# Patient Record
Sex: Male | Born: 1985 | Race: Black or African American | Hispanic: No | Marital: Married | Smoking: Former smoker
Health system: Southern US, Community
[De-identification: ages and names within clinical notes are randomized; demographics above are authoritative.]

## PROBLEM LIST (undated history)

## (undated) DIAGNOSIS — M48061 Spinal stenosis, lumbar region without neurogenic claudication: Secondary | ICD-10-CM

## (undated) DIAGNOSIS — J45909 Unspecified asthma, uncomplicated: Secondary | ICD-10-CM

---
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
## 2006-10-12 ENCOUNTER — Emergency Department (HOSPITAL_COMMUNITY): Admission: EM | Admit: 2006-10-12 | Discharge: 2006-10-12 | Payer: Self-pay | Admitting: Emergency Medicine

## 2020-08-25 HISTORY — PX: COLONOSCOPY: SHX174

## 2021-05-01 ENCOUNTER — Other Ambulatory Visit: Payer: Self-pay | Admitting: Neurological Surgery

## 2021-05-01 DIAGNOSIS — G959 Disease of spinal cord, unspecified: Secondary | ICD-10-CM

## 2021-05-11 ENCOUNTER — Ambulatory Visit
Admission: RE | Admit: 2021-05-11 | Discharge: 2021-05-11 | Disposition: A | Discharge status: Home / Self Care | Payer: Self-pay | Source: Ambulatory Visit | Attending: Neurological Surgery | Admitting: Neurological Surgery

## 2021-05-11 DIAGNOSIS — G959 Disease of spinal cord, unspecified: Secondary | ICD-10-CM

## 2021-05-12 ENCOUNTER — Other Ambulatory Visit: Payer: Self-pay

## 2021-05-14 ENCOUNTER — Other Ambulatory Visit: Payer: Self-pay | Admitting: Neurological Surgery

## 2021-05-22 ENCOUNTER — Other Ambulatory Visit: Payer: Self-pay | Admitting: Neurological Surgery

## 2021-05-23 NOTE — Pre-Procedure Instructions (Signed)
Surgical Instructions    Your procedure is scheduled on Tuesday, June 04, 2021 at 11:21 AM.  Report to [facility] Main Entrance "A" at 9:20 A.M., then check in with the Admitting office.  Call this number if you have problems the morning of surgery:  [phone]   If you have any questions prior to your surgery date call [phone]: Open Monday-Friday 8am-4pm    Remember:  Do not eat or drink after midnight the night before your surgery    Take these medicines the morning of surgery with A SIP OF WATER  DULoxetine (CYMBALTA) gabapentin (NEURONTIN)  IF NEEDED: HYDROcodone-acetaminophen (NORCO/VICODIN) omeprazole (PRILOSEC)  As of today, STOP taking any Aspirin (unless otherwise instructed by your surgeon) Aleve, naproxen (NAPROSYN), Ibuprofen, Motrin, Advil, Goody's, BC's, diclofenac Sodium (VOLTAREN), all herbal medications, fish oil, and all vitamins.                     Do NOT Smoke (Tobacco/Vaping) or drink Alcohol 24 hours prior to your procedure.  If you use a CPAP at night, you may bring all equipment for your overnight stay.   Contacts, glasses, piercing's, hearing aid's, dentures or partials may not be worn into surgery, please bring cases for these belongings.    For patients admitted to the hospital, discharge time will be determined by your treatment team.   Patients discharged the day of surgery will not be allowed to drive home, and someone needs to stay with them for 24 hours.  NO VISITORS WILL BE ALLOWED IN PRE-OP WHERE PATIENTS GET READY FOR SURGERY.  ONLY 1 SUPPORT PERSON MAY BE PRESENT IN THE WAITING ROOM WHILE YOU ARE IN SURGERY.  IF YOU ARE TO BE ADMITTED, ONCE YOU ARE IN YOUR ROOM YOU WILL BE ALLOWED TWO (2) VISITORS.  Minor children may have two parents present. Special consideration for safety and communication needs will be reviewed on a case by case basis.   Special instructions:   [place]- Preparing For Surgery  Before surgery, you can  play an important role. Because skin is not sterile, your skin needs to be as free of germs as possible. You can reduce the number of germs on your skin by washing with CHG (chlorahexidine gluconate) Soap before surgery.  CHG is an antiseptic cleaner which kills germs and bonds with the skin to continue killing germs even after washing.    Oral Hygiene is also important to reduce your risk of infection.  Remember - BRUSH YOUR TEETH THE MORNING OF SURGERY WITH YOUR REGULAR TOOTHPASTE  Please do not use if you have an allergy to CHG or antibacterial soaps. If your skin becomes reddened/irritated stop using the CHG.  Do not shave (including legs and underarms) for at least 48 hours prior to first CHG shower. It is OK to shave your face.  Please follow these instructions carefully.   Shower the NIGHT BEFORE SURGERY and the MORNING OF SURGERY  If you chose to wash your hair, wash your hair first as usual with your normal shampoo.  After you shampoo, rinse your hair and body thoroughly to remove the shampoo.  Use CHG Soap as you would any other liquid soap. You can apply CHG directly to the skin and wash gently with a scrungie or a clean washcloth.   Apply the CHG Soap to your body ONLY FROM THE NECK DOWN.  Do not use on open wounds or open sores. Avoid contact with your eyes, ears, mouth and genitals (private  parts). Wash Face and genitals (private parts)  with your normal soap.   Wash thoroughly, paying special attention to the area where your surgery will be performed.  Thoroughly rinse your body with warm water from the neck down.  DO NOT shower/wash with your normal soap after using and rinsing off the CHG Soap.  Pat yourself dry with a CLEAN TOWEL.  Wear CLEAN PAJAMAS to bed the night before surgery  Place CLEAN SHEETS on your bed the night before your surgery  DO NOT SLEEP WITH PETS.   Day of Surgery: Shower with CHG soap. Do not wear jewelry. Do not wear lotions, powders,  colognes, or deodorant. Men may shave face and neck. Do not bring valuables to the hospital. [facility] is not responsible for any belongings or valuables. Wear Clean/Comfortable clothing the morning of surgery Remember to brush your teeth WITH YOUR REGULAR TOOTHPASTE.   Please read over the following fact sheets that you were given.   3 days prior to your procedure or After your COVID test   You are not required to quarantine however you are required to wear a well-fitting mask when you are out and around people not in your household. If your mask becomes wet or soiled, replace with a new one.   Wash your hands often with soap and water for 20 seconds or clean your hands with an alcohol-based hand sanitizer that contains at least 60% alcohol.   Do not share personal items.   Notify your provider:  o if you are in close contact with someone who has COVID  o or if you develop a fever of 100.4 or greater, sneezing, cough, sore throat, shortness of breath or body aches.

## 2021-05-24 ENCOUNTER — Encounter (HOSPITAL_COMMUNITY)
Admission: RE | Admit: 2021-05-24 | Discharge: 2021-05-24 | Disposition: A | Discharge status: Home / Self Care | Payer: 59 | Source: Ambulatory Visit | Attending: Neurological Surgery | Admitting: Neurological Surgery

## 2021-05-24 ENCOUNTER — Encounter (HOSPITAL_COMMUNITY): Payer: Self-pay

## 2021-05-24 ENCOUNTER — Other Ambulatory Visit: Payer: Self-pay

## 2021-05-24 DIAGNOSIS — Z01812 Encounter for preprocedural laboratory examination: Secondary | ICD-10-CM | POA: Diagnosis not present

## 2021-05-24 HISTORY — DX: Unspecified asthma, uncomplicated: J45.909

## 2021-05-24 LAB — TYPE AND SCREEN
ABO/RH(D): O POS
Antibody Screen: NEGATIVE

## 2021-05-24 LAB — SURGICAL PCR SCREEN
MRSA, PCR: NEGATIVE
Staphylococcus aureus: NEGATIVE

## 2021-05-24 LAB — CBC
HCT: 43.5 % (ref 39.0–52.0)
Hemoglobin: 14.3 g/dL (ref 13.0–17.0)
MCH: 28.6 pg (ref 26.0–34.0)
MCHC: 32.9 g/dL (ref 30.0–36.0)
MCV: 87 fL (ref 80.0–100.0)
Platelets: 281 10*3/uL (ref 150–400)
RBC: 5 MIL/uL (ref 4.22–5.81)
RDW: 13.7 % (ref 11.5–15.5)
WBC: 8.4 10*3/uL (ref 4.0–10.5)
nRBC: 0 % (ref 0.0–0.2)

## 2021-05-24 NOTE — Progress Notes (Signed)
PCP - Lonie Peak PA-C Cardiologist - Denies  PPM/ICD - Denies  Chest x-ray - N/A EKG - N/A Stress Test - Denies ECHO - Denies Cardiac Cath - Denies  Sleep Study - Denies  Patient denies having diabetes.  Blood Thinner Instructions: N/A Aspirin Instructions: N/A  ERAS Protcol - No  COVID TEST- Scheduled 05/31/21 @ 1100   Anesthesia review: No  Patient denies shortness of breath, fever, cough and chest pain at PAT appointment   All instructions explained to the patient, with a verbal understanding of the material. Patient agrees to go over the instructions while at home for a better understanding. Patient also instructed to self quarantine after being tested for COVID-19. The opportunity to ask questions was provided.

## 2021-05-31 ENCOUNTER — Other Ambulatory Visit (HOSPITAL_COMMUNITY)
Admission: RE | Admit: 2021-05-31 | Discharge: 2021-05-31 | Disposition: A | Discharge status: Home / Self Care | Payer: 59 | Source: Ambulatory Visit | Attending: Neurological Surgery | Admitting: Neurological Surgery

## 2021-05-31 DIAGNOSIS — Z01812 Encounter for preprocedural laboratory examination: Secondary | ICD-10-CM | POA: Insufficient documentation

## 2021-05-31 DIAGNOSIS — Z20822 Contact with and (suspected) exposure to covid-19: Secondary | ICD-10-CM | POA: Diagnosis not present

## 2021-05-31 LAB — SARS CORONAVIRUS 2 (TAT 6-24 HRS): SARS Coronavirus 2: NEGATIVE

## 2021-06-04 ENCOUNTER — Encounter (HOSPITAL_COMMUNITY): Payer: Self-pay | Admitting: Neurological Surgery

## 2021-06-04 ENCOUNTER — Ambulatory Visit (HOSPITAL_COMMUNITY): Payer: 59 | Admitting: Anesthesiology

## 2021-06-04 ENCOUNTER — Other Ambulatory Visit: Payer: Self-pay

## 2021-06-04 ENCOUNTER — Observation Stay (HOSPITAL_COMMUNITY)
Admission: RE | Admit: 2021-06-04 | Discharge: 2021-06-05 | Disposition: A | Discharge status: Home / Self Care | Payer: 59 | Attending: Neurological Surgery | Admitting: Neurological Surgery

## 2021-06-04 ENCOUNTER — Ambulatory Visit (HOSPITAL_COMMUNITY): Payer: 59

## 2021-06-04 ENCOUNTER — Encounter (HOSPITAL_COMMUNITY)
Admission: RE | Disposition: A | Discharge status: Home / Self Care | Payer: Self-pay | Source: Home / Self Care | Attending: Neurological Surgery

## 2021-06-04 DIAGNOSIS — G959 Disease of spinal cord, unspecified: Secondary | ICD-10-CM | POA: Diagnosis present

## 2021-06-04 DIAGNOSIS — Z79899 Other long term (current) drug therapy: Secondary | ICD-10-CM | POA: Insufficient documentation

## 2021-06-04 DIAGNOSIS — M4722 Other spondylosis with radiculopathy, cervical region: Secondary | ICD-10-CM | POA: Diagnosis not present

## 2021-06-04 DIAGNOSIS — M4802 Spinal stenosis, cervical region: Secondary | ICD-10-CM | POA: Insufficient documentation

## 2021-06-04 DIAGNOSIS — J45909 Unspecified asthma, uncomplicated: Secondary | ICD-10-CM | POA: Insufficient documentation

## 2021-06-04 DIAGNOSIS — M4712 Other spondylosis with myelopathy, cervical region: Secondary | ICD-10-CM | POA: Insufficient documentation

## 2021-06-04 DIAGNOSIS — Z419 Encounter for procedure for purposes other than remedying health state, unspecified: Secondary | ICD-10-CM

## 2021-06-04 HISTORY — PX: ANTERIOR CERVICAL DECOMP/DISCECTOMY FUSION: SHX1161

## 2021-06-04 LAB — ABO/RH: ABO/RH(D): O POS

## 2021-06-04 SURGERY — ANTERIOR CERVICAL DECOMPRESSION/DISCECTOMY FUSION 1 LEVEL
Anesthesia: General | Site: Neck

## 2021-06-04 MED ORDER — MIDAZOLAM HCL 2 MG/2ML IJ SOLN
INTRAMUSCULAR | Status: DC | PRN
  Administered 2021-06-04: 2 mg via INTRAVENOUS

## 2021-06-04 MED ORDER — ONDANSETRON HCL 4 MG PO TABS: 4.0000 mg | ORAL_TABLET | Freq: Four times a day (QID) | ORAL | Status: DC | PRN

## 2021-06-04 MED ORDER — HYDROCODONE-ACETAMINOPHEN 7.5-325 MG PO TABS: 1.0000 | ORAL_TABLET | ORAL | Status: DC | PRN

## 2021-06-04 MED ORDER — DULOXETINE HCL 30 MG PO CPEP
30.0000 mg | ORAL_CAPSULE | Freq: Every day | ORAL | Status: DC
  Administered 2021-06-05: 30 mg via ORAL
  Filled 2021-06-04: qty 1

## 2021-06-04 MED ORDER — THROMBIN 5000 UNITS EX SOLR
CUTANEOUS | Status: DC | PRN
  Administered 2021-06-04 (×2): 5000 [IU] via TOPICAL

## 2021-06-04 MED ORDER — SUGAMMADEX SODIUM 200 MG/2ML IV SOLN
INTRAVENOUS | Status: DC | PRN
  Administered 2021-06-04: 250 mg via INTRAVENOUS

## 2021-06-04 MED ORDER — FENTANYL CITRATE (PF) 100 MCG/2ML IJ SOLN
INTRAMUSCULAR | Status: AC
  Filled 2021-06-04: qty 2

## 2021-06-04 MED ORDER — PROPOFOL 10 MG/ML IV BOLUS
INTRAVENOUS | Status: AC
  Filled 2021-06-04: qty 40

## 2021-06-04 MED ORDER — CHLORHEXIDINE GLUCONATE CLOTH 2 % EX PADS: 6.0000 | MEDICATED_PAD | Freq: Once | CUTANEOUS | Status: DC

## 2021-06-04 MED ORDER — ONDANSETRON HCL 4 MG/2ML IJ SOLN
INTRAMUSCULAR | Status: DC | PRN
  Administered 2021-06-04: 4 mg via INTRAVENOUS

## 2021-06-04 MED ORDER — MIDAZOLAM HCL 2 MG/2ML IJ SOLN
INTRAMUSCULAR | Status: AC
  Filled 2021-06-04: qty 2

## 2021-06-04 MED ORDER — TRIAMCINOLONE ACETONIDE 40 MG/ML IJ SUSP
INTRAMUSCULAR | Status: AC
  Filled 2021-06-04: qty 5

## 2021-06-04 MED ORDER — FENTANYL CITRATE (PF) 250 MCG/5ML IJ SOLN
INTRAMUSCULAR | Status: AC
  Filled 2021-06-04: qty 5

## 2021-06-04 MED ORDER — GABAPENTIN 300 MG PO CAPS
300.0000 mg | ORAL_CAPSULE | Freq: Every day | ORAL | Status: DC
  Administered 2021-06-05: 300 mg via ORAL
  Filled 2021-06-04 (×2): qty 1

## 2021-06-04 MED ORDER — THROMBIN 5000 UNITS EX SOLR: OROMUCOSAL | Status: DC | PRN

## 2021-06-04 MED ORDER — SODIUM CHLORIDE 0.9 % IV SOLN
250.0000 mL | INTRAVENOUS | Status: DC
  Administered 2021-06-04: 250 mL via INTRAVENOUS

## 2021-06-04 MED ORDER — CEFAZOLIN SODIUM-DEXTROSE 2-4 GM/100ML-% IV SOLN
2.0000 g | INTRAVENOUS | Status: AC
  Administered 2021-06-04: 2 g via INTRAVENOUS
  Filled 2021-06-04: qty 100

## 2021-06-04 MED ORDER — METHOCARBAMOL 500 MG PO TABS
500.0000 mg | ORAL_TABLET | Freq: Four times a day (QID) | ORAL | Status: DC | PRN
  Administered 2021-06-04 – 2021-06-05 (×3): 500 mg via ORAL
  Filled 2021-06-04 (×3): qty 1

## 2021-06-04 MED ORDER — LACTATED RINGERS IV SOLN: INTRAVENOUS | Status: DC

## 2021-06-04 MED ORDER — PROPOFOL 10 MG/ML IV BOLUS
INTRAVENOUS | Status: DC | PRN
  Administered 2021-06-04: 200 mg via INTRAVENOUS

## 2021-06-04 MED ORDER — PANTOPRAZOLE SODIUM 40 MG PO TBEC
80.0000 mg | DELAYED_RELEASE_TABLET | Freq: Every day | ORAL | Status: DC
  Administered 2021-06-04 – 2021-06-05 (×2): 80 mg via ORAL
  Filled 2021-06-04 (×2): qty 2

## 2021-06-04 MED ORDER — CEFAZOLIN SODIUM-DEXTROSE 2-4 GM/100ML-% IV SOLN
2.0000 g | Freq: Three times a day (TID) | INTRAVENOUS | Status: AC
  Administered 2021-06-04 – 2021-06-05 (×2): 2 g via INTRAVENOUS
  Filled 2021-06-04 (×2): qty 100

## 2021-06-04 MED ORDER — METHOCARBAMOL 1000 MG/10ML IJ SOLN
500.0000 mg | Freq: Four times a day (QID) | INTRAVENOUS | Status: DC | PRN
  Filled 2021-06-04: qty 5

## 2021-06-04 MED ORDER — FENTANYL CITRATE (PF) 100 MCG/2ML IJ SOLN
25.0000 ug | INTRAMUSCULAR | Status: DC | PRN
  Administered 2021-06-04 (×3): 50 ug via INTRAVENOUS

## 2021-06-04 MED ORDER — FENTANYL CITRATE (PF) 250 MCG/5ML IJ SOLN
INTRAMUSCULAR | Status: DC | PRN
  Administered 2021-06-04: 50 ug via INTRAVENOUS
  Administered 2021-06-04: 25 ug via INTRAVENOUS
  Administered 2021-06-04 (×3): 50 ug via INTRAVENOUS
  Administered 2021-06-04: 25 ug via INTRAVENOUS
  Administered 2021-06-04: 50 ug via INTRAVENOUS

## 2021-06-04 MED ORDER — SUCCINYLCHOLINE CHLORIDE 200 MG/10ML IV SOSY
PREFILLED_SYRINGE | INTRAVENOUS | Status: DC | PRN
  Administered 2021-06-04: 140 mg via INTRAVENOUS

## 2021-06-04 MED ORDER — GABAPENTIN 300 MG PO CAPS
600.0000 mg | ORAL_CAPSULE | Freq: Every day | ORAL | Status: DC
  Administered 2021-06-04: 600 mg via ORAL
  Filled 2021-06-04: qty 2

## 2021-06-04 MED ORDER — 0.9 % SODIUM CHLORIDE (POUR BTL) OPTIME
TOPICAL | Status: DC | PRN
  Administered 2021-06-04: 1000 mL

## 2021-06-04 MED ORDER — ORAL CARE MOUTH RINSE: 15.0000 mL | Freq: Once | OROMUCOSAL | Status: AC

## 2021-06-04 MED ORDER — CHLORHEXIDINE GLUCONATE 0.12 % MT SOLN
15.0000 mL | Freq: Once | OROMUCOSAL | Status: AC
  Administered 2021-06-04: 15 mL via OROMUCOSAL
  Filled 2021-06-04: qty 15

## 2021-06-04 MED ORDER — OXYCODONE HCL 5 MG PO TABS
10.0000 mg | ORAL_TABLET | ORAL | Status: DC | PRN
  Administered 2021-06-04 – 2021-06-05 (×6): 10 mg via ORAL
  Filled 2021-06-04 (×6): qty 2

## 2021-06-04 MED ORDER — LIDOCAINE 2% (20 MG/ML) 5 ML SYRINGE
INTRAMUSCULAR | Status: DC | PRN
  Administered 2021-06-04: 100 mg via INTRAVENOUS

## 2021-06-04 MED ORDER — TRIAMCINOLONE ACETONIDE 40 MG/ML IJ SUSP
INTRAMUSCULAR | Status: DC | PRN
  Administered 2021-06-04: 40 mg via INTRAMUSCULAR

## 2021-06-04 MED ORDER — KETOROLAC TROMETHAMINE 15 MG/ML IJ SOLN
15.0000 mg | Freq: Four times a day (QID) | INTRAMUSCULAR | Status: DC
  Administered 2021-06-04 – 2021-06-05 (×3): 15 mg via INTRAVENOUS
  Filled 2021-06-04 (×3): qty 1

## 2021-06-04 MED ORDER — LABETALOL HCL 100 MG PO TABS
100.0000 mg | ORAL_TABLET | Freq: Four times a day (QID) | ORAL | Status: DC | PRN
  Administered 2021-06-04: 100 mg via ORAL
  Filled 2021-06-04 (×2): qty 1

## 2021-06-04 MED ORDER — DEXAMETHASONE SODIUM PHOSPHATE 10 MG/ML IJ SOLN
INTRAMUSCULAR | Status: DC | PRN
Start: 2021-06-04 — End: 2021-06-04
  Administered 2021-06-04: 10 mg via INTRAVENOUS

## 2021-06-04 MED ORDER — ROCURONIUM BROMIDE 10 MG/ML (PF) SYRINGE
PREFILLED_SYRINGE | INTRAVENOUS | Status: DC | PRN
  Administered 2021-06-04: 50 mg via INTRAVENOUS
  Administered 2021-06-04: 20 mg via INTRAVENOUS
  Administered 2021-06-04: 30 mg via INTRAVENOUS

## 2021-06-04 MED ORDER — ACETAMINOPHEN 650 MG RE SUPP: 650.0000 mg | RECTAL | Status: DC | PRN

## 2021-06-04 MED ORDER — SODIUM CHLORIDE 0.9% FLUSH: 3.0000 mL | Freq: Two times a day (BID) | INTRAVENOUS | Status: DC

## 2021-06-04 MED ORDER — PHENOL 1.4 % MT LIQD: 1.0000 | OROMUCOSAL | Status: DC | PRN

## 2021-06-04 MED ORDER — ACETAMINOPHEN 325 MG PO TABS
650.0000 mg | ORAL_TABLET | ORAL | Status: DC | PRN
  Administered 2021-06-04 – 2021-06-05 (×3): 650 mg via ORAL
  Filled 2021-06-04 (×3): qty 2

## 2021-06-04 MED ORDER — ONDANSETRON HCL 4 MG/2ML IJ SOLN: 4.0000 mg | Freq: Four times a day (QID) | INTRAMUSCULAR | Status: DC | PRN

## 2021-06-04 MED ORDER — SENNOSIDES-DOCUSATE SODIUM 8.6-50 MG PO TABS: 1.0000 | ORAL_TABLET | Freq: Every evening | ORAL | Status: DC | PRN

## 2021-06-04 MED ORDER — SODIUM CHLORIDE 0.9% FLUSH: 3.0000 mL | INTRAVENOUS | Status: DC | PRN

## 2021-06-04 MED ORDER — MENTHOL 3 MG MT LOZG
1.0000 | LOZENGE | OROMUCOSAL | Status: DC | PRN
  Filled 2021-06-04: qty 9

## 2021-06-04 SURGICAL SUPPLY — 56 items
BAG COUNTER SPONGE SURGICOUNT (BAG) ×2 IMPLANT
BAND RUBBER #18 3X1/16 STRL (MISCELLANEOUS) ×4 IMPLANT
BENZOIN TINCTURE PRP APPL 2/3 (GAUZE/BANDAGES/DRESSINGS) IMPLANT
BIT DRILL NEURO 2X3.1 SFT TUCH (MISCELLANEOUS) ×1 IMPLANT
BLADE CLIPPER SURG (BLADE) IMPLANT
BUR CARBIDE MATCH 3.0 (BURR) ×2 IMPLANT
CANISTER SUCT 3000ML PPV (MISCELLANEOUS) ×2 IMPLANT
CARTRIDGE OIL MAESTRO DRILL (MISCELLANEOUS) ×1 IMPLANT
COVER MAYO STAND STRL (DRAPES) ×4 IMPLANT
DIFFUSER DRILL AIR PNEUMATIC (MISCELLANEOUS) ×2 IMPLANT
DRAPE C-ARM 42X72 X-RAY (DRAPES) ×2 IMPLANT
DRAPE HALF SHEET 40X57 (DRAPES) IMPLANT
DRAPE LAPAROTOMY 100X72X124 (DRAPES) ×2 IMPLANT
DRAPE MICROSCOPE LEICA (MISCELLANEOUS) ×2 IMPLANT
DRILL NEURO 2X3.1 SOFT TOUCH (MISCELLANEOUS) ×2
DURAPREP 6ML APPLICATOR 50/CS (WOUND CARE) ×2 IMPLANT
ELECT COATED BLADE 2.86 ST (ELECTRODE) ×2 IMPLANT
ELECT REM PT RETURN 9FT ADLT (ELECTROSURGICAL) ×2
ELECTRODE REM PT RTRN 9FT ADLT (ELECTROSURGICAL) ×1 IMPLANT
EVACUATOR 1/8 PVC DRAIN (DRAIN) IMPLANT
GAUZE 4X4 16PLY ~~LOC~~+RFID DBL (SPONGE) IMPLANT
GLOVE EXAM NITRILE LRG STRL (GLOVE) IMPLANT
GLOVE EXAM NITRILE XL STR (GLOVE) IMPLANT
GLOVE EXAM NITRILE XS STR PU (GLOVE) IMPLANT
GLOVE SRG 8 PF TXTR STRL LF DI (GLOVE) ×1 IMPLANT
GLOVE SURG LTX SZ8 (GLOVE) ×4 IMPLANT
GLOVE SURG UNDER POLY LF SZ8 (GLOVE) ×2
GOWN STRL REUS W/ TWL LRG LVL3 (GOWN DISPOSABLE) IMPLANT
GOWN STRL REUS W/ TWL XL LVL3 (GOWN DISPOSABLE) ×1 IMPLANT
GOWN STRL REUS W/TWL 2XL LVL3 (GOWN DISPOSABLE) IMPLANT
GOWN STRL REUS W/TWL LRG LVL3 (GOWN DISPOSABLE)
GOWN STRL REUS W/TWL XL LVL3 (GOWN DISPOSABLE) ×2
HEMOSTAT POWDER KIT SURGIFOAM (HEMOSTASIS) ×2 IMPLANT
KIT BASIN OR (CUSTOM PROCEDURE TRAY) ×2 IMPLANT
KIT TURNOVER KIT B (KITS) ×2 IMPLANT
NEEDLE HYPO 22GX1.5 SAFETY (NEEDLE) ×2 IMPLANT
NEEDLE SPNL 18GX3.5 QUINCKE PK (NEEDLE) ×2 IMPLANT
NS IRRIG 1000ML POUR BTL (IV SOLUTION) ×2 IMPLANT
OIL CARTRIDGE MAESTRO DRILL (MISCELLANEOUS) ×2
PACK LAMINECTOMY NEURO (CUSTOM PROCEDURE TRAY) ×2 IMPLANT
PAD ARMBOARD 7.5X6 YLW CONV (MISCELLANEOUS) ×6 IMPLANT
PIN DISTRACTION 14MM (PIN) ×4 IMPLANT
PLATE ANT CERV OZARK 22 1LVL (Plate) ×2 IMPLANT
PUTTY BONE 100 VESUVIUS 1CC (Putty) ×2 IMPLANT
SCREW FIXED ST OZARK 4X16 (Screw) ×4 IMPLANT
SCREW VA ST OZARK 4X16 (Plate) ×4 IMPLANT
SPACER ANGLD CASCAD 16X13X8 7D (Spacer) ×2 IMPLANT
SPONGE INTESTINAL PEANUT (DISPOSABLE) ×2 IMPLANT
SPONGE SURGIFOAM ABS GEL SZ50 (HEMOSTASIS) ×4 IMPLANT
STAPLER VISISTAT 35W (STAPLE) IMPLANT
STRIP CLOSURE SKIN 1/2X4 (GAUZE/BANDAGES/DRESSINGS) ×2 IMPLANT
TAPE SURG TRANSPORE 1 IN (GAUZE/BANDAGES/DRESSINGS) ×1 IMPLANT
TAPE SURGICAL TRANSPORE 1 IN (GAUZE/BANDAGES/DRESSINGS) ×2
TOWEL GREEN STERILE (TOWEL DISPOSABLE) ×2 IMPLANT
TOWEL GREEN STERILE FF (TOWEL DISPOSABLE) ×2 IMPLANT
WATER STERILE IRR 1000ML POUR (IV SOLUTION) ×2 IMPLANT

## 2021-06-04 NOTE — Progress Notes (Signed)
Dr. Chilton Si aware of BP.

## 2021-06-04 NOTE — Op Note (Signed)
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 Arthrodesis C4-5, anterior interbody technique  2. Placement of intervertebral biomechanical device C4-5; K2 M [place] titanium interbody 8 x 13 x 16 mm 3. Placement of anterior instrumentation consisting of interbody plate and screws -K2 M [place] plate, 20 mm; 16 mm screws 4. Discectomy at C4-5 for decompression of spinal cord and exiting nerve roots  5. Use of morselized bone allograft  6. Use of intraoperative microscope  SURGEON: Dr. Monia Pouch, DO  ASSISTANT: Dr. Barnett Abu, MD  ANESTHESIA: General Endotracheal  EBL: 50 cc  SPECIMENS: None  DRAINS: None  COMPLICATIONS: None immediate  CONDITION: Hemodynamically stable to PACU  HISTORY: Dan Gonzalez is a 35 y.o. y.o. male who initially presented to the outpatient clinic with signs and symptoms consistent with lumbar radiculopathy.  On examination he was found to have hyperreflexia and complained of some numbness and tingling in his fingertips.  MRI of the cervical spine revealed severe stenosis at C4-5 with cord signal change due to chronic myelopathy.  Given this, I recommend surgical decompression and fusion in the form of a C4-5 ACDF.  We discussed all risks, benefits and expected outcomes as well as alternatives including conservative measures however has difficulty with fine motor movement of his upper extremities have progressively worsened over the past few months therefore he elected undergo surgical intervention.  PROCEDURE IN DETAIL: The patient was brought to the operating room and transferred to the operative table. After induction of general anesthesia, the patient was positioned on the operative table in the supine position with all pressure points meticulously padded. The skin of the neck was  then prepped and draped in the usual sterile fashion.  After timeout was conducted, skin incision was then made sharply with a 10 blade and Bovie electrocautery was used to dissect the subcutaneous tissue until the platysma was identified. The platysma was then divided and undermined. The sternocleidomastoid muscle was then identified and, utilizing natural fascial planes in the neck, the prevertebral fascia was identified and the carotid sheath was retracted laterally and the trachea and esophagus retracted medially. Again using fluoroscopy, the correct disc space was identified, C4-5. Bovie electrocautery was used to dissect in the subperiosteal plane and elevate the bilateral longus coli muscles. Self-retaining retractors were then placed under the longus coli muscles bilaterally. At this point, the microscope was draped and brought into the field, and the remainder of the case was done under the microscope using microdissecting technique.  Distraction pins were placed in midline above and below the disc space.  The disc space was placed in distraction.  The disc space was incised sharply and rongeurs were use to initially complete a discectomy. The high-speed drill was then used to complete discectomy until the posterior annulus was identified and removed and the posterior longitudinal ligament was identified.  Using a high-speed drill, posterior osteophytes were removed superiorly and inferiorly.  Using a microcurette, the PLL was elevated, and Kerrison rongeurs were used to remove the posterior longitudinal ligament and the ventral thecal sac was identified. Using a combination of curettes and ronguers, complete decompression of the thecal sac and exiting nerve roots at this level was completed, and verified using micro-nerve hook. The disc space was taken out of distraction.  There is significant osteophytic spurring bilaterally at this disc space.  Epidural hemostasis was achieved with  Surgifoam.  Having completed our decompression, attention was turned to placement of the intervertebral device. Trial spacers were used to select a 8 mm graft. This graft was then filled with morcellized allograft, and inserted under live fluoroscopy.  After placement of the intervertebral device, the above anterior cervical plate was selected, and placed across the interspace. Using a high-speed drill, the cortex of the cervical vertebral bodies was punctured, and screws inserted in the level above and below, C4, C5. Final fluoroscopic images in AP and lateral projections were taken to confirm good hardware placement.  The plate was final tightened to the manufacturer's recommendation and the screws were locked in place.  At this point, after all counts were verified to be correct, meticulous hemostasis was secured using a combination of bipolar electrocautery and passive hemostatics.  Skin was closed with staples.  Sterile dressing was applied.  The patient tolerated the procedure well and was extubated in the room and taken to the postanesthesia care unit in stable condition.

## 2021-06-04 NOTE — Transfer of Care (Signed)
Immediate Anesthesia Transfer of Care Note  Patient: Deniz Hannan  Procedure(s) Performed: ACDF C45 (Neck)  Patient Location: PACU  Anesthesia Type:General  Level of Consciousness: awake, alert  and patient cooperative  Airway & Oxygen Therapy: Patient Spontanous Breathing and Patient connected to face mask oxygen  Post-op Assessment: Report given to RN and Post -op Vital signs reviewed and stable  Post vital signs: Reviewed and stable  Last Vitals:  Vitals Value Taken Time  BP 148/102 06/04/21 1241  Temp    Pulse 101 06/04/21 1242  Resp 14 06/04/21 1241  SpO2 88 % 06/04/21 1242  Vitals shown include unvalidated device data.  Last Pain:  Vitals:   06/04/21 0927  TempSrc:   PainSc: 8          Complications: No notable events documented.

## 2021-06-04 NOTE — Evaluation (Signed)
Pts diastolic bp consistently above 100s. Md green aware and educated pt to seek attention from pcp once discharged. Md okay with pt going to 3c.

## 2021-06-04 NOTE — Anesthesia Procedure Notes (Signed)
Procedure Name: Intubation Date/Time: 06/04/2021 10:20 AM Performed by: Carlos American, CRNA Pre-anesthesia Checklist: Patient identified, Emergency Drugs available, Suction available and Patient being monitored Patient Re-evaluated:Patient Re-evaluated prior to induction Oxygen Delivery Method: Circle System Utilized Preoxygenation: Pre-oxygenation with 100% oxygen Induction Type: IV induction Ventilation: Mask ventilation without difficulty Laryngoscope Size: Glidescope and 4 Grade View: Grade I Tube type: Oral Tube size: 7.5 mm Number of attempts: 1 Airway Equipment and Method: Stylet Placement Confirmation: ETT inserted through vocal cords under direct vision, positive ETCO2 and breath sounds checked- equal and bilateral Secured at: 23 cm Tube secured with: Tape Dental Injury: Teeth and Oropharynx as per pre-operative assessment

## 2021-06-04 NOTE — Anesthesia Preprocedure Evaluation (Addendum)
Anesthesia Evaluation  Patient identified by MRN, date of birth, ID band Patient awake    Reviewed: Allergy & Precautions, NPO status , Patient's Chart, lab work & pertinent test results  Airway Mallampati: II  TM Distance: >3 FB     Dental   Pulmonary asthma ,    breath sounds clear to auscultation       Cardiovascular negative cardio ROS   Rhythm:Regular Rate:Normal     Neuro/Psych    GI/Hepatic negative GI ROS, Neg liver ROS,   Endo/Other  negative endocrine ROS  Renal/GU negative Renal ROS     Musculoskeletal   Abdominal   Peds  Hematology   Anesthesia Other Findings   Reproductive/Obstetrics                             Anesthesia Physical Anesthesia Plan  ASA: 3  Anesthesia Plan: General   Post-op Pain Management:    Induction: Intravenous  PONV Risk Score and Plan: 3 and Ondansetron, Dexamethasone and Midazolam  Airway Management Planned: Oral ETT  Additional Equipment:   Intra-op Plan:   Post-operative Plan: Extubation in OR  Informed Consent: I have reviewed the patients History and Physical, chart, labs and discussed the procedure including the risks, benefits and alternatives for the proposed anesthesia with the patient or authorized representative who has indicated his/her understanding and acceptance.     Dental advisory given  Plan Discussed with: CRNA and Anesthesiologist  Anesthesia Plan Comments:         Anesthesia Quick Evaluation

## 2021-06-04 NOTE — Anesthesia Postprocedure Evaluation (Signed)
Anesthesia Post Note  Patient: Dan Gonzalez  Procedure(s) Performed: ACDF C45 (Neck)     Patient location during evaluation: PACU Anesthesia Type: General Level of consciousness: awake Pain management: pain level controlled Vital Signs Assessment: post-procedure vital signs reviewed and stable Respiratory status: spontaneous breathing Cardiovascular status: stable Postop Assessment: no apparent nausea or vomiting Anesthetic complications: no   No notable events documented.  Last Vitals:  Vitals:   06/04/21 1340 06/04/21 1355  BP: (!) 164/107 (!) 165/110  Pulse: 98 93  Resp: (!) 21 20  Temp:    SpO2: 98% 97%    Last Pain:  Vitals:   06/04/21 1355  TempSrc:   PainSc: 0-No pain                 Winston Misner

## 2021-06-04 NOTE — H&P (Addendum)
    Providing Compassionate, Quality Care - Together  NEUROSURGERY HISTORY & PHYSICAL   Dan Gonzalez is an 35 y.o. male.   Chief Complaint: BUE numbness HPI: This is a 34 yo M with BUE numbness and tingling that was found to have severe stenosis C4-5 with chronic cord signal change. Presents today for ACDF. No significant changes.  He does complain of right greater than left upper extremity radiculopathy.  He has chronic numbness tingling in his fingertips.  He does intermittently drop objects.  Past Medical History:  Diagnosis Date   Asthma     Past Surgical History:  Procedure Laterality Date   COLONOSCOPY  2022    History reviewed. No pertinent family history. Social History:  reports that he has never smoked. He has never used smokeless tobacco. He reports current drug use. Drug: Marijuana. He reports that he does not drink alcohol.  Allergies: No Known Allergies  Medications Prior to Admission  Medication Sig Dispense Refill   diclofenac Sodium (VOLTAREN) 1 % GEL Apply 1 application topically daily as needed (pain).     DULoxetine (CYMBALTA) 30 MG capsule Take 30 mg by mouth daily.     gabapentin (NEURONTIN) 300 MG capsule Take 300-600 mg by mouth See admin instructions. 300 ,g in  The morning, 600 mg at bedtime     HYDROcodone-acetaminophen (NORCO/VICODIN) 5-325 MG tablet Take 1 tablet by mouth 3 (three) times daily as needed for pain.     naproxen (NAPROSYN) 500 MG tablet Take 500 mg by mouth 2 (two) times daily as needed for pain.     omeprazole (PRILOSEC) 40 MG capsule Take 40 mg by mouth daily as needed (acid reflux).      No results found for this or any previous visit (from the past 48 hour(s)). No results found.  ROS All +/- in HPI  Blood pressure (!) 166/101, pulse 98, temperature 98.1 F (36.7 C), temperature source Oral, resp. rate 18, height 5\' 9"  (1.753 m), weight 106.6 kg, SpO2 98 %. Physical Exam  Aox3 PERRLA EOMI Cn 2-12 intact BUE 4+/5 BLE  5/5 Reflexes bilateral upper extremities 3/4 Hoffmann's positive bilaterally   Assessment/Plan 35 yo FM with:  C4-5 severe stenosis with cord signal change, myelopathy -OR today for ACDF C4-5, all risks/benefits and expected outcomes discussed and agreed upon.    Thank you for allowing me to participate in this patient's care.  Please do not hesitate to call with questions or concerns.   30, DO Neurosurgeon [facility] Cell: [phone]

## 2021-06-04 NOTE — Progress Notes (Signed)
Orthopedic Tech Progress Note Patient Details:  Dan Gonzalez Feb 17, 1986 [id number]  Ortho Devices Type of Ortho Device: Aspen cervical collar Ortho Device/Splint Location: Neck Ortho Device/Splint Interventions: Ordered     Dropped off with PACU nurse.  Darleen Crocker 06/04/2021, 1:42 PM

## 2021-06-05 DIAGNOSIS — M4722 Other spondylosis with radiculopathy, cervical region: Secondary | ICD-10-CM | POA: Diagnosis not present

## 2021-06-05 MED ORDER — METHOCARBAMOL 500 MG PO TABS: 500.0000 mg | ORAL_TABLET | Freq: Four times a day (QID) | ORAL | 1 refills | Status: AC | PRN

## 2021-06-05 MED ORDER — HYDROCODONE-ACETAMINOPHEN 7.5-325 MG PO TABS: 1.0000 | ORAL_TABLET | ORAL | 0 refills | Status: DC | PRN

## 2021-06-05 NOTE — Discharge Summary (Signed)
  Physician Discharge Summary  Patient ID: Dan Gonzalez MRN: [id number] DOB/AGE: 03/13/1986 35 y.o.  Admit date: 06/04/2021 Discharge date: 06/05/2021  Admission Diagnoses:  Cervical myelopathy with severe stenosis, C4-5 with cord signal change  Discharge Diagnoses:  Same Active Problems:   Cervical myelopathy [facility])   Discharged Condition: Stable  Hospital Course:  [name] is a 35 y.o. male that presented for an elective ACDF C4-5 on 06/04/2021.  He tolerated the surgery well.  Postoperatively his pain was controlled on oral medication.  His numbness and tingling was improving compared to preoperative.  He was ambulating independently, having normal bowel bladder function.  His incision was healing appropriately, his dressing was clean dry and intact.  Treatments: Surgery -C4-5 ACDF  Discharge Exam: Blood pressure (!) 151/99, pulse 93, temperature 98 F (36.7 C), temperature source Oral, resp. rate 17, height 5\' 9"  (1.753 m), weight 106.6 kg, SpO2 100 %. Awake, alert, oriented x3 PERRLA Speech fluent, appropriate CN grossly intact 5/5 BUE/BLE Trachea midline Neck soft Wound c/d/i  Disposition: Discharge disposition: 01-Home or Self Care      Discharge Instructions     Incentive spirometry RT   Complete by: As directed       Allergies as of 06/05/2021   No Known Allergies      Medication List     STOP taking these medications    naproxen 500 MG tablet Commonly known as: NAPROSYN       TAKE these medications    diclofenac Sodium 1 % Gel Commonly known as: VOLTAREN Apply 1 application topically daily as needed (pain).   DULoxetine 30 MG capsule Commonly known as: CYMBALTA Take 30 mg by mouth daily.   gabapentin 300 MG capsule Commonly known as: NEURONTIN Take 300-600 mg by mouth See admin instructions. 300 ,g in  The morning, 600 mg at bedtime   HYDROcodone-acetaminophen 5-325 MG tablet Commonly known as: NORCO/VICODIN Take 1 tablet  by mouth 3 (three) times daily as needed for pain. What changed: Another medication with the same name was added. Make sure you understand how and when to take each.   HYDROcodone-acetaminophen 7.5-325 MG tablet Commonly known as: NORCO Take 1 tablet by mouth every 4 (four) hours as needed for moderate pain ((score 4 to 6)). What changed: You were already taking a medication with the same name, and this prescription was added. Make sure you understand how and when to take each.   methocarbamol 500 MG tablet Commonly known as: ROBAXIN Take 1 tablet (500 mg total) by mouth every 6 (six) hours as needed for muscle spasms.   omeprazole 40 MG capsule Commonly known as: PRILOSEC Take 40 mg by mouth daily as needed (acid reflux).        Follow-up Information     12-23-1976, NP Follow up in 1 week(s).   Specialty: Neurosurgery Contact information: [address] [place] [phone]         Karina Nofsinger, [phone] C, DO Follow up in 1 week(s).   Contact information: [address] [place] [phone]                 Signed: [phone] Nikiya Starn 06/05/2021, 8:06 AM

## 2021-06-05 NOTE — Evaluation (Signed)
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 He lives in a 1 level home with 6 STE with his wife and children. Pt was supervision overall for ADLs and mobility. He demonstrated great understanding of back precautions, and how to manage his cervical collar. He did report some numbness in his RUE, mostly in his hand; and RLE weakness. Pt does not require further OT acutely. Recommend d/c to home with supervision initially for all ADLs and mobility.      Recommendations for follow up therapy are one component of a multi-disciplinary discharge planning process, led by the attending physician.  Recommendations may be updated based on patient status, additional functional criteria and insurance authorization.   Follow Up Recommendations  No OT follow up    Equipment Recommendations  3 in 1 bedside commode       Precautions / Restrictions Precautions Precautions: Cervical Precaution Booklet Issued: Yes (comment) Required Braces or Orthoses: Cervical Brace Cervical Brace: Hard collar (applied in sitting, can be off for showers)      Mobility Bed Mobility Overal bed mobility: Needs Assistance Bed Mobility: Rolling;Sidelying to Sit;Sit to Sidelying Rolling: Supervision Sidelying to sit: Supervision     Sit to sidelying: Supervision General bed mobility comments: log roll    Transfers Overall transfer level: Needs assistance Equipment used: None Transfers: Sit to/from Stand Sit to Stand: Supervision         General transfer comment: great safety, no LOB, no AD    Balance Overall  balance assessment: Modified Independent                                         ADL either performed or assessed with clinical judgement   ADL Overall ADL's : Needs assistance/impaired Eating/Feeding: Independent;Sitting   Grooming: Supervision/safety;Standing;Cueing for compensatory techniques   Upper Body Bathing: Supervision/ safety;Sitting;Cueing for compensatory techniques   Lower Body Bathing: Supervison/ safety;Sit to/from stand;Cueing for compensatory techniques   Upper Body Dressing : Supervision/safety;Sitting   Lower Body Dressing: Supervision/safety;Sit to/from stand;Cueing for compensatory techniques   Toilet Transfer: Supervision/safety;Ambulation   Toileting- Clothing Manipulation and Hygiene: Supervision/safety;Sit to/from stand       Functional mobility during ADLs: Supervision/safety General ADL Comments: supervision for safety only - verbal cues for compensatory techniques     Vision Baseline Vision/History: 0 No visual deficits Ability to See in Adequate Light: 0 Adequate Patient Visual Report: No change from baseline Vision Assessment?: No apparent visual deficits     Perception Perception Perception Tested?: No   Praxis      Pertinent Vitals/Pain Pain Assessment: Faces Faces Pain Scale: Hurts a little bit Pain Intervention(s): Monitored during session     Hand Dominance     Extremity/Trunk Assessment Upper Extremity Assessment Upper Extremity Assessment: Overall WFL for tasks assessed   Lower Extremity Assessment Lower Extremity Assessment: Overall WFL for tasks assessed   Cervical / Trunk Assessment Cervical / Trunk Assessment: Other exceptions Cervical / Trunk Exceptions: s/p ACDF   Communication  Communication Communication: No difficulties   Cognition Arousal/Alertness: Awake/alert Behavior During Therapy: WFL for tasks assessed/performed Overall Cognitive Status: Within Functional Limits for tasks assessed                                      General Comments  VSS on RA - wife present for session    Exercises     Shoulder Instructions      Home Living Family/patient expects to be discharged to:: Private residence Living Arrangements: Spouse/significant other;Children (wife, 5 kids) Available Help at Discharge: Family;Available 24 hours/day Type of Home: House Home Access: Stairs to enter [employer] of Steps: 6 Entrance Stairs-Rails: Right;Left Home Layout: One level     Bathroom Shower/Tub: IT trainer: Standard     Home Equipment: None          Prior Functioning/Environment Level of Independence: Independent        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), drives        OT Problem List: Decreased range of motion;Decreased activity tolerance;Impaired balance (sitting and/or standing);Decreased safety awareness;Decreased knowledge of use of DME or AE;Decreased knowledge of precautions;Pain      OT Treatment/Interventions:      OT Goals(Current goals can be found in the care plan section) Acute Rehab OT Goals Patient Stated Goal: home soon OT Goal Formulation: With patient  OT Frequency:     Barriers to D/C:            Co-evaluation              AM-PAC OT "6 Clicks" Daily Activity     Outcome Measure Help from another person eating meals?: None Help from another person taking care of personal grooming?: A Little Help from another person toileting, which includes using toliet, bedpan, or urinal?: None Help from another person bathing (including washing, rinsing, drying)?: None Help from another person to put on and taking off regular upper body clothing?: None Help from another person to put on and taking off regular lower body clothing?: None 6 Click Score: 23   End of Session Equipment Utilized During Treatment: Cervical collar  Activity Tolerance: Patient tolerated  treatment well Patient left: in bed;with call bell/phone within reach;with family/visitor present  OT Visit Diagnosis: Pain;Muscle weakness (generalized) (M62.81)                Time: 1610-9604 OT Time Calculation (min): 14 min Charges:  OT General Charges $OT Visit: 1 Visit OT Evaluation $OT Eval Low Complexity: 1 Low  Veronica Fretz A Sharena Dibenedetto 06/05/2021, 8:53 AM

## 2021-06-05 NOTE — Plan of Care (Signed)
Patient alert and oriented, mae's well, voiding adequate amount of urine, swallowing without difficulty, no c/o pain at time of discharge. Patient discharged home with family. Script and discharged instructions given to patient. Patient and family stated understanding of instructions given. Patient has an appointment with Dr Dawley in 2 weeks 

## 2021-06-05 NOTE — Evaluation (Signed)
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 He overall verbalizes good pain control and is moving well. Ambulating hallway distances with no assistive device and negotiated a half flight of steps with a railing. Of note, he does have chronic back pain with radicular symptoms to distal RLE, causing a mild antalgic gait pattern. It is exacerbated with extension and alleviated with mild flexion.Pt reports plan for future surgery. Provided education regarding brace use and cleaning, cervical precautions and activity recommendations. No PT follow up recommended at this time. Thank you for this consult.      Recommendations for follow up therapy are one component of a multi-disciplinary discharge planning process, led by the attending physician.  Recommendations may be updated based on patient status, additional functional criteria and insurance authorization.  Follow Up Recommendations No PT follow up    Equipment Recommendations  None recommended by PT    Recommendations for Other Services       Precautions / Restrictions Precautions Precautions: Cervical Precaution Booklet Issued: Yes (comment) Required Braces or Orthoses: Cervical Brace Cervical Brace: Hard collar (applied in sitting, can be off for showers) Restrictions Weight Bearing Restrictions: No      Mobility  Bed Mobility Overal bed mobility: Needs Assistance Bed Mobility: Rolling;Sidelying to Sit;Sit to Sidelying Rolling: Supervision Sidelying to sit: Supervision     Sit to sidelying: Supervision General bed mobility comments: OOB upon entry    Transfers Overall transfer level:  Independent Equipment used: None Transfers: Sit to/from Stand Sit to Stand: Supervision         General transfer comment: great safety, no LOB, no AD  Ambulation/Gait Ambulation/Gait assistance: Modified independent (Device/Increase time) Gait Distance (Feet): 400 Feet Assistive device: None Gait Pattern/deviations: Step-through pattern;Decreased stance time - right;Antalgic Gait velocity: decreased   General Gait Details: Mildly antalgic, which is his baseline in setting of RLE pain, no gross imbalance noted  Stairs Stairs: Yes Stairs assistance: Modified independent (Device/Increase time) Stair Management: One rail Right Number of Stairs: 10 General stair comments: pt modifying activity with step by step and cues provided for sequencing/technique  Wheelchair Mobility    Modified Rankin (Stroke Patients Only)       Balance Overall balance assessment: Modified Independent                                           Pertinent Vitals/Pain Pain Assessment: Faces Faces Pain Scale: Hurts little more Pain Location: surgical site, back with radicular pain throughout RLE Pain Intervention(s): Monitored during session    Home Living Family/patient expects to be discharged to:: Private residence Living Arrangements: Spouse/significant other;Children (wife, 5 kids) Available Help at Discharge: Family;Available 24 hours/day Type of Home: House Home Access: Stairs to enter Entrance Stairs-Rails: Doctor, general practice of Steps: 6 Home Layout: One level Home Equipment: None      Prior Function Level of Independence: Independent         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 Pt reporting sensation changes i.e. tingling from back to foot LLE Deficits / Details: Strength 5/5    Cervical / Trunk Assessment Cervical / Trunk Assessment: Other exceptions Cervical / Trunk Exceptions: s/p ACDF  Communication   Communication: No difficulties  Cognition Arousal/Alertness: Awake/alert Behavior During Therapy: WFL for tasks assessed/performed Overall Cognitive Status: Within Functional Limits for tasks assessed                                        General Comments General comments (skin integrity, edema, etc.): VSS on RA - wife present for session    Exercises     Assessment/Plan    PT Assessment Patent does not need any further PT services  PT Problem List         PT Treatment Interventions      PT Goals (Current goals can be found in the Care Plan section)  Acute Rehab PT Goals Patient Stated Goal: home soon PT Goal Formulation: All assessment and education complete, DC therapy    Frequency     Barriers to discharge        Co-evaluation               AM-PAC PT "6 Clicks" Mobility  Outcome Measure Help needed turning from your back to your side while in a flat bed without using bedrails?: None Help needed moving from lying on your back to sitting on the side of a flat bed without using bedrails?: None Help needed moving to and from a bed to a chair (including a wheelchair)?: None Help needed standing up from a chair using your arms (e.g., wheelchair or bedside chair)?: None Help needed to walk in hospital room?: None Help needed climbing 3-5 steps with a railing? : None 6 Click Score: 24    End of Session   Activity Tolerance: Patient tolerated treatment well Patient left: in bed;with call bell/phone within reach;with family/visitor present Nurse Communication: Mobility status PT Visit Diagnosis: Difficulty in walking, not elsewhere classified (R26.2);Pain Pain  - Right/Left: Right Pain - part of body: Leg (neck)    Time: 2094-7096 PT Time Calculation (min) (ACUTE ONLY): 14 min   Charges:   PT Evaluation $PT Eval Low Complexity: 1 Low          Lillia Pauls, PT, DPT Acute Rehabilitation Services Pager [phone] Office [phone]   Norval Morton 06/05/2021, 9:47 AM

## 2021-06-07 ENCOUNTER — Encounter (HOSPITAL_COMMUNITY): Payer: Self-pay | Admitting: Neurological Surgery

## 2021-09-23 ENCOUNTER — Other Ambulatory Visit: Payer: Self-pay | Admitting: Neurological Surgery

## 2021-10-09 ENCOUNTER — Other Ambulatory Visit: Payer: Self-pay | Admitting: Neurological Surgery

## 2021-10-15 ENCOUNTER — Ambulatory Visit: Admit: 2021-10-15 | Payer: Medicaid Other | Admitting: Neurological Surgery

## 2021-10-15 SURGERY — LUMBAR LAMINECTOMY/DECOMPRESSION MICRODISCECTOMY 2 LEVELS
Anesthesia: General | Laterality: Right

## 2021-10-18 NOTE — Pre-Procedure Instructions (Signed)
Surgical Instructions    Your procedure is scheduled on Thursday, March 2nd.  Report to [facility] Main Entrance "A" at 10:25 A.M., then check in with the Admitting office.  Call this number if you have problems the morning of surgery:  [phone]   If you have any questions prior to your surgery date call [phone]: Open Monday-Friday 8am-4pm    Remember:  Do not eat after midnight the night before your surgery  You may drink clear liquids until 9:25 a.m. the morning of your surgery.   Clear liquids allowed are: Water, Non-Citrus Juices (without pulp), Carbonated Beverages, Clear Tea, Black Coffee Only (NO MILK, CREAM OR POWDERED CREAMER of any kind), and Gatorade.    Take these medicines the morning of surgery with A SIP OF WATER  gabapentin (NEURONTIN)  HYDROcodone-acetaminophen (NORCO/VICODIN)-as needed methocarbamol (ROBAXIN)-as needed  As of today, STOP taking any Aspirin (unless otherwise instructed by your surgeon) Aleve, Naproxen, Ibuprofen, Motrin, Advil, Goody's, BC's, all herbal medications, fish oil, and all vitamins.                     Do NOT Smoke (Tobacco/Vaping) for 24 hours prior to your procedure.  If you use a CPAP at night, you may bring your mask/headgear for your overnight stay.   Contacts, glasses, piercing's, hearing aid's, dentures or partials may not be worn into surgery, please bring cases for these belongings.    For patients admitted to the hospital, discharge time will be determined by your treatment team.   Patients discharged the day of surgery will not be allowed to drive home, and someone needs to stay with them for 24 hours.  NO VISITORS WILL BE ALLOWED IN PRE-OP WHERE PATIENTS ARE PREPPED FOR SURGERY.  ONLY 1 SUPPORT PERSON MAY BE PRESENT IN THE WAITING ROOM WHILE YOU ARE IN SURGERY.  IF YOU ARE TO BE ADMITTED, ONCE YOU ARE IN YOUR ROOM YOU WILL BE ALLOWED TWO (2) VISITORS. (1) VISITOR MAY STAY OVERNIGHT BUT MUST ARRIVE TO THE ROOM BY 8pm.   Minor children may have two parents present. Special consideration for safety and communication needs will be reviewed on a case by case basis.   Special instructions:   [place] [facility]- Preparing For Surgery  Before surgery, you can play an important role. Because skin is not sterile, your skin needs to be as free of germs as possible. You can reduce the number of germs on your skin by washing with CHG (chlorahexidine gluconate) Soap before surgery.  CHG is an antiseptic cleaner which kills germs and bonds with the skin to continue killing germs even after washing.    Oral Hygiene is also important to reduce your risk of infection.  Remember - BRUSH YOUR TEETH THE MORNING OF SURGERY WITH YOUR REGULAR TOOTHPASTE  Please do not use if you have an allergy to CHG or antibacterial soaps. If your skin becomes reddened/irritated stop using the CHG.  Do not shave (including legs and underarms) for at least 48 hours prior to first CHG shower. It is OK to shave your face.  Please follow these instructions carefully.   Shower the NIGHT BEFORE SURGERY and the MORNING OF SURGERY  If you chose to wash your hair, wash your hair first as usual with your normal shampoo.  After you shampoo, rinse your hair and body thoroughly to remove the shampoo.  Use CHG Soap as you would any other liquid soap. You can apply CHG directly to the skin and wash  gently with a scrungie or a clean washcloth.   Apply the CHG Soap to your body ONLY FROM THE NECK DOWN.  Do not use on open wounds or open sores. Avoid contact with your eyes, ears, mouth and genitals (private parts). Wash Face and genitals (private parts)  with your normal soap.   Wash thoroughly, paying special attention to the area where your surgery will be performed.  Thoroughly rinse your body with warm water from the neck down.  DO NOT shower/wash with your normal soap after using and rinsing off the CHG Soap.  Pat yourself dry with a CLEAN TOWEL.  Wear  CLEAN PAJAMAS to bed the night before surgery  Place CLEAN SHEETS on your bed the night before your surgery  DO NOT SLEEP WITH PETS.   Day of Surgery: Shower with CHG soap. Do not wear jewelry. Do not wear lotions, powders, colognes, or deodorant. Men may shave face and neck. Do not bring valuables to the hospital. [facility] is not responsible for any belongings or valuables. Wear Clean/Comfortable clothing the morning of surgery Remember to brush your teeth WITH YOUR REGULAR TOOTHPASTE.   Please read over the following fact sheets that you were given.   3 days prior to your procedure or After your COVID test   You are not required to quarantine however you are required to wear a well-fitting mask when you are out and around people not in your household. If your mask becomes wet or soiled, replace with a new one.   Wash your hands often with soap and water for 20 seconds or clean your hands with an alcohol-based hand sanitizer that contains at least 60% alcohol.   Do not share personal items.   Notify your provider:  o if you are in close contact with someone who has COVID  o or if you develop a fever of 100.4 or greater, sneezing, cough, sore throat, shortness of breath or body aches.

## 2021-10-21 ENCOUNTER — Inpatient Hospital Stay (HOSPITAL_COMMUNITY)
Admission: RE | Admit: 2021-10-21 | Discharge: 2021-10-21 | Disposition: A | Discharge status: Home / Self Care | Payer: Medicaid Other | Source: Ambulatory Visit

## 2021-10-22 ENCOUNTER — Encounter (HOSPITAL_COMMUNITY): Payer: Self-pay

## 2021-10-22 ENCOUNTER — Other Ambulatory Visit: Payer: Self-pay

## 2021-10-22 ENCOUNTER — Encounter (HOSPITAL_COMMUNITY)
Admission: RE | Admit: 2021-10-22 | Discharge: 2021-10-22 | Disposition: A | Discharge status: Home / Self Care | Payer: Commercial Managed Care - HMO | Source: Ambulatory Visit | Attending: Neurological Surgery | Admitting: Neurological Surgery

## 2021-10-22 VITALS — BP 136/107 | HR 113 | Temp 98.1°F | Resp 24 | Ht 69.0 in | Wt 238.8 lb

## 2021-10-22 DIAGNOSIS — Z01812 Encounter for preprocedural laboratory examination: Secondary | ICD-10-CM | POA: Diagnosis not present

## 2021-10-22 DIAGNOSIS — Z20822 Contact with and (suspected) exposure to covid-19: Secondary | ICD-10-CM | POA: Insufficient documentation

## 2021-10-22 DIAGNOSIS — G959 Disease of spinal cord, unspecified: Secondary | ICD-10-CM | POA: Insufficient documentation

## 2021-10-22 DIAGNOSIS — Z01818 Encounter for other preprocedural examination: Secondary | ICD-10-CM

## 2021-10-22 HISTORY — DX: Spinal stenosis, lumbar region without neurogenic claudication: M48.061

## 2021-10-22 LAB — SARS CORONAVIRUS 2 BY RT PCR (HOSPITAL ORDER, PERFORMED IN ~~LOC~~ HOSPITAL LAB): SARS Coronavirus 2: NEGATIVE

## 2021-10-22 LAB — CBC
HCT: 48.8 % (ref 39.0–52.0)
Hemoglobin: 16.5 g/dL (ref 13.0–17.0)
MCH: 28.8 pg (ref 26.0–34.0)
MCHC: 33.8 g/dL (ref 30.0–36.0)
MCV: 85.2 fL (ref 80.0–100.0)
Platelets: 301 10*3/uL (ref 150–400)
RBC: 5.73 MIL/uL (ref 4.22–5.81)
RDW: 13.2 % (ref 11.5–15.5)
WBC: 7.5 10*3/uL (ref 4.0–10.5)
nRBC: 0 % (ref 0.0–0.2)

## 2021-10-22 LAB — SURGICAL PCR SCREEN
MRSA, PCR: NEGATIVE
Staphylococcus aureus: NEGATIVE

## 2021-10-22 NOTE — Progress Notes (Signed)
PCP - Cyndi Bender PA Cardiologist - Denies  Chest x-ray - Not indicated EKG - Not indicated Stress Test - Denies ECHO - Denies Cardiac Cath - Denies  Sleep Study - Denies  DM - Denies  COVID TEST- 10/22/21  Anesthesia review: No  Patient denies shortness of breath, fever, cough and chest pain at PAT appointment   All instructions explained to the patient, with a verbal understanding of the material. Patient agrees to go over the instructions while at home for a better understanding. Patient also instructed to wear a mask while in public after being tested for COVID-19. The opportunity to ask questions was provided.

## 2021-10-23 ENCOUNTER — Other Ambulatory Visit (HOSPITAL_COMMUNITY): Payer: Managed Care, Other (non HMO)

## 2021-10-24 ENCOUNTER — Ambulatory Visit (HOSPITAL_COMMUNITY): Payer: Commercial Managed Care - HMO

## 2021-10-24 ENCOUNTER — Encounter (HOSPITAL_COMMUNITY)
Admission: RE | Disposition: A | Discharge status: Home / Self Care | Payer: Self-pay | Source: Home / Self Care | Attending: Neurological Surgery

## 2021-10-24 ENCOUNTER — Observation Stay (HOSPITAL_COMMUNITY)
Admission: RE | Admit: 2021-10-24 | Discharge: 2021-10-25 | Disposition: A | Discharge status: Home / Self Care | Payer: Commercial Managed Care - HMO | Attending: Neurological Surgery | Admitting: Neurological Surgery

## 2021-10-24 ENCOUNTER — Ambulatory Visit (HOSPITAL_BASED_OUTPATIENT_CLINIC_OR_DEPARTMENT_OTHER): Payer: Commercial Managed Care - HMO

## 2021-10-24 DIAGNOSIS — J45909 Unspecified asthma, uncomplicated: Secondary | ICD-10-CM | POA: Insufficient documentation

## 2021-10-24 DIAGNOSIS — M48061 Spinal stenosis, lumbar region without neurogenic claudication: Secondary | ICD-10-CM | POA: Diagnosis present

## 2021-10-24 DIAGNOSIS — M48062 Spinal stenosis, lumbar region with neurogenic claudication: Secondary | ICD-10-CM | POA: Diagnosis present

## 2021-10-24 DIAGNOSIS — Z87891 Personal history of nicotine dependence: Secondary | ICD-10-CM | POA: Diagnosis not present

## 2021-10-24 DIAGNOSIS — Z419 Encounter for procedure for purposes other than remedying health state, unspecified: Secondary | ICD-10-CM

## 2021-10-24 HISTORY — PX: LUMBAR LAMINECTOMY/DECOMPRESSION MICRODISCECTOMY: SHX5026

## 2021-10-24 SURGERY — LUMBAR LAMINECTOMY/DECOMPRESSION MICRODISCECTOMY 2 LEVELS
Anesthesia: General | Site: Spine Lumbar | Laterality: Right

## 2021-10-24 MED ORDER — FENTANYL CITRATE (PF) 100 MCG/2ML IJ SOLN
INTRAMUSCULAR | Status: AC
Start: 2021-10-24 — End: 2021-10-25
  Filled 2021-10-24: qty 2

## 2021-10-24 MED ORDER — SODIUM CHLORIDE 0.9% FLUSH: 3.0000 mL | INTRAVENOUS | Status: DC | PRN

## 2021-10-24 MED ORDER — OXYCODONE HCL 5 MG PO TABS
5.0000 mg | ORAL_TABLET | Freq: Once | ORAL | Status: AC | PRN
  Administered 2021-10-24: 5 mg via ORAL

## 2021-10-24 MED ORDER — MORPHINE SULFATE (PF) 2 MG/ML IV SOLN
2.0000 mg | INTRAVENOUS | Status: DC | PRN
  Administered 2021-10-24: 2 mg via INTRAVENOUS
  Filled 2021-10-24: qty 1

## 2021-10-24 MED ORDER — GABAPENTIN 300 MG PO CAPS
600.0000 mg | ORAL_CAPSULE | Freq: Every day | ORAL | Status: DC
  Administered 2021-10-24: 600 mg via ORAL
  Filled 2021-10-24: qty 2

## 2021-10-24 MED ORDER — GABAPENTIN 300 MG PO CAPS: 300.0000 mg | ORAL_CAPSULE | ORAL | Status: DC

## 2021-10-24 MED ORDER — HYDROCODONE-ACETAMINOPHEN 7.5-325 MG PO TABS
2.0000 | ORAL_TABLET | ORAL | Status: DC | PRN
  Administered 2021-10-24 – 2021-10-25 (×4): 2 via ORAL
  Filled 2021-10-24 (×4): qty 2

## 2021-10-24 MED ORDER — METHOCARBAMOL 500 MG PO TABS
500.0000 mg | ORAL_TABLET | Freq: Four times a day (QID) | ORAL | Status: DC | PRN
  Administered 2021-10-24 – 2021-10-25 (×2): 500 mg via ORAL
  Filled 2021-10-24 (×2): qty 1

## 2021-10-24 MED ORDER — THROMBIN 5000 UNITS EX SOLR
CUTANEOUS | Status: AC
  Filled 2021-10-24: qty 5000

## 2021-10-24 MED ORDER — SODIUM CHLORIDE 0.9% FLUSH: 3.0000 mL | Freq: Two times a day (BID) | INTRAVENOUS | Status: DC

## 2021-10-24 MED ORDER — MIDAZOLAM HCL 2 MG/2ML IJ SOLN
INTRAMUSCULAR | Status: DC | PRN
  Administered 2021-10-24: 2 mg via INTRAVENOUS

## 2021-10-24 MED ORDER — BUPIVACAINE LIPOSOME 1.3 % IJ SUSP
INTRAMUSCULAR | Status: AC
  Filled 2021-10-24: qty 20

## 2021-10-24 MED ORDER — ROCURONIUM BROMIDE 10 MG/ML (PF) SYRINGE
PREFILLED_SYRINGE | INTRAVENOUS | Status: DC | PRN
Start: 2021-10-24 — End: 2021-10-24
  Administered 2021-10-24: 40 mg via INTRAVENOUS
  Administered 2021-10-24: 60 mg via INTRAVENOUS
  Administered 2021-10-24: 20 mg via INTRAVENOUS

## 2021-10-24 MED ORDER — PHENYLEPHRINE 40 MCG/ML (10ML) SYRINGE FOR IV PUSH (FOR BLOOD PRESSURE SUPPORT)
PREFILLED_SYRINGE | INTRAVENOUS | Status: AC
  Filled 2021-10-24: qty 10

## 2021-10-24 MED ORDER — ONDANSETRON HCL 4 MG/2ML IJ SOLN: 4.0000 mg | Freq: Four times a day (QID) | INTRAMUSCULAR | Status: DC | PRN

## 2021-10-24 MED ORDER — PHENOL 1.4 % MT LIQD: 1.0000 | OROMUCOSAL | Status: DC | PRN

## 2021-10-24 MED ORDER — ORAL CARE MOUTH RINSE: 15.0000 mL | Freq: Once | OROMUCOSAL | Status: AC

## 2021-10-24 MED ORDER — LIDOCAINE 2% (20 MG/ML) 5 ML SYRINGE
INTRAMUSCULAR | Status: DC | PRN
  Administered 2021-10-24: 100 mg via INTRAVENOUS

## 2021-10-24 MED ORDER — LIDOCAINE-EPINEPHRINE 1 %-1:100000 IJ SOLN
INTRAMUSCULAR | Status: DC | PRN
  Administered 2021-10-24: 5 mL

## 2021-10-24 MED ORDER — CHLORHEXIDINE GLUCONATE 0.12 % MT SOLN
15.0000 mL | Freq: Once | OROMUCOSAL | Status: AC
  Administered 2021-10-24: 15 mL via OROMUCOSAL
  Filled 2021-10-24: qty 15

## 2021-10-24 MED ORDER — KETOROLAC TROMETHAMINE 15 MG/ML IJ SOLN
15.0000 mg | Freq: Four times a day (QID) | INTRAMUSCULAR | Status: DC
  Administered 2021-10-24 – 2021-10-25 (×3): 15 mg via INTRAVENOUS
  Filled 2021-10-24 (×3): qty 1

## 2021-10-24 MED ORDER — DEXMEDETOMIDINE (PRECEDEX) IN NS 20 MCG/5ML (4 MCG/ML) IV SYRINGE
PREFILLED_SYRINGE | INTRAVENOUS | Status: AC
  Filled 2021-10-24: qty 5

## 2021-10-24 MED ORDER — FENTANYL CITRATE (PF) 100 MCG/2ML IJ SOLN
25.0000 ug | INTRAMUSCULAR | Status: DC | PRN
  Administered 2021-10-24: 50 ug via INTRAVENOUS
  Administered 2021-10-24 (×4): 25 ug via INTRAVENOUS

## 2021-10-24 MED ORDER — SUGAMMADEX SODIUM 200 MG/2ML IV SOLN
INTRAVENOUS | Status: DC | PRN
  Administered 2021-10-24: 300 mg via INTRAVENOUS

## 2021-10-24 MED ORDER — FENTANYL CITRATE (PF) 100 MCG/2ML IJ SOLN
INTRAMUSCULAR | Status: DC | PRN
  Administered 2021-10-24: 50 ug via INTRAVENOUS

## 2021-10-24 MED ORDER — CEFAZOLIN SODIUM-DEXTROSE 2-3 GM-%(50ML) IV SOLR
INTRAVENOUS | Status: DC | PRN
  Administered 2021-10-24: 2 g via INTRAVENOUS

## 2021-10-24 MED ORDER — CEFAZOLIN SODIUM-DEXTROSE 2-4 GM/100ML-% IV SOLN
INTRAVENOUS | Status: AC
  Filled 2021-10-24: qty 100

## 2021-10-24 MED ORDER — LACTATED RINGERS IV SOLN
INTRAVENOUS | Status: DC
Start: 2021-10-24 — End: 2021-10-24

## 2021-10-24 MED ORDER — BUPIVACAINE-EPINEPHRINE (PF) 0.5% -1:200000 IJ SOLN
INTRAMUSCULAR | Status: DC | PRN
  Administered 2021-10-24: 5 mL

## 2021-10-24 MED ORDER — ACETAMINOPHEN 325 MG PO TABS: 650.0000 mg | ORAL_TABLET | ORAL | Status: DC | PRN

## 2021-10-24 MED ORDER — BUPIVACAINE-EPINEPHRINE 0.5% -1:200000 IJ SOLN
INTRAMUSCULAR | Status: AC
  Filled 2021-10-24: qty 1

## 2021-10-24 MED ORDER — FENTANYL CITRATE (PF) 250 MCG/5ML IJ SOLN
INTRAMUSCULAR | Status: AC
  Filled 2021-10-24: qty 5

## 2021-10-24 MED ORDER — FENTANYL CITRATE (PF) 100 MCG/2ML IJ SOLN
INTRAMUSCULAR | Status: AC
  Filled 2021-10-24: qty 2

## 2021-10-24 MED ORDER — DEXAMETHASONE SODIUM PHOSPHATE 10 MG/ML IJ SOLN
INTRAMUSCULAR | Status: DC | PRN
  Administered 2021-10-24: 10 mg via INTRAVENOUS

## 2021-10-24 MED ORDER — GABAPENTIN 300 MG PO CAPS
300.0000 mg | ORAL_CAPSULE | Freq: Every day | ORAL | Status: DC
  Administered 2021-10-25: 300 mg via ORAL
  Filled 2021-10-24: qty 1

## 2021-10-24 MED ORDER — ROCURONIUM BROMIDE 10 MG/ML (PF) SYRINGE
PREFILLED_SYRINGE | INTRAVENOUS | Status: AC
  Filled 2021-10-24: qty 10

## 2021-10-24 MED ORDER — SUGAMMADEX SODIUM 500 MG/5ML IV SOLN
INTRAVENOUS | Status: AC
  Filled 2021-10-24: qty 5

## 2021-10-24 MED ORDER — PHENYLEPHRINE 40 MCG/ML (10ML) SYRINGE FOR IV PUSH (FOR BLOOD PRESSURE SUPPORT)
PREFILLED_SYRINGE | INTRAVENOUS | Status: DC | PRN
  Administered 2021-10-24: 160 ug via INTRAVENOUS
  Administered 2021-10-24 (×2): 120 ug via INTRAVENOUS

## 2021-10-24 MED ORDER — OXYCODONE HCL 5 MG/5ML PO SOLN: 5.0000 mg | Freq: Once | ORAL | Status: AC | PRN

## 2021-10-24 MED ORDER — DEXMEDETOMIDINE (PRECEDEX) IN NS 20 MCG/5ML (4 MCG/ML) IV SYRINGE
PREFILLED_SYRINGE | INTRAVENOUS | Status: DC | PRN
  Administered 2021-10-24: 12 ug via INTRAVENOUS

## 2021-10-24 MED ORDER — FENTANYL CITRATE (PF) 250 MCG/5ML IJ SOLN
INTRAMUSCULAR | Status: DC | PRN
  Administered 2021-10-24 (×6): 50 ug via INTRAVENOUS
  Administered 2021-10-24: 100 ug via INTRAVENOUS

## 2021-10-24 MED ORDER — HYDROCODONE-ACETAMINOPHEN 5-325 MG PO TABS: 1.0000 | ORAL_TABLET | ORAL | Status: DC | PRN

## 2021-10-24 MED ORDER — METHYLPREDNISOLONE ACETATE 80 MG/ML IJ SUSP
INTRAMUSCULAR | Status: DC | PRN
  Administered 2021-10-24: 40 mg

## 2021-10-24 MED ORDER — METHYLPREDNISOLONE ACETATE 80 MG/ML IJ SUSP
INTRAMUSCULAR | Status: AC
  Filled 2021-10-24: qty 1

## 2021-10-24 MED ORDER — ONDANSETRON HCL 4 MG/2ML IJ SOLN
INTRAMUSCULAR | Status: DC | PRN
  Administered 2021-10-24: 4 mg via INTRAVENOUS

## 2021-10-24 MED ORDER — SODIUM CHLORIDE 0.9 % IV SOLN: 250.0000 mL | INTRAVENOUS | Status: DC

## 2021-10-24 MED ORDER — MICROFIBRILLAR COLL HEMOSTAT EX POWD
CUTANEOUS | Status: DC | PRN
  Administered 2021-10-24: 1 g via TOPICAL

## 2021-10-24 MED ORDER — ACETAMINOPHEN 650 MG RE SUPP: 650.0000 mg | RECTAL | Status: DC | PRN

## 2021-10-24 MED ORDER — CEFAZOLIN SODIUM-DEXTROSE 2-4 GM/100ML-% IV SOLN
2.0000 g | Freq: Three times a day (TID) | INTRAVENOUS | Status: AC
  Administered 2021-10-24 – 2021-10-25 (×2): 2 g via INTRAVENOUS
  Filled 2021-10-24 (×2): qty 100

## 2021-10-24 MED ORDER — BUPIVACAINE LIPOSOME 1.3 % IJ SUSP
INTRAMUSCULAR | Status: DC | PRN
  Administered 2021-10-24: 20 mL

## 2021-10-24 MED ORDER — MICROFIBRILLAR COLL HEMOSTAT EX POWD
CUTANEOUS | Status: AC
  Filled 2021-10-24: qty 5

## 2021-10-24 MED ORDER — METHOCARBAMOL 1000 MG/10ML IJ SOLN
500.0000 mg | Freq: Four times a day (QID) | INTRAVENOUS | Status: DC | PRN
  Filled 2021-10-24: qty 5

## 2021-10-24 MED ORDER — DOCUSATE SODIUM 100 MG PO CAPS
100.0000 mg | ORAL_CAPSULE | Freq: Two times a day (BID) | ORAL | Status: DC
  Administered 2021-10-24 – 2021-10-25 (×2): 100 mg via ORAL
  Filled 2021-10-24 (×2): qty 1

## 2021-10-24 MED ORDER — LIDOCAINE-EPINEPHRINE 1 %-1:100000 IJ SOLN
INTRAMUSCULAR | Status: AC
  Filled 2021-10-24: qty 1

## 2021-10-24 MED ORDER — PROPOFOL 10 MG/ML IV BOLUS
INTRAVENOUS | Status: DC | PRN
  Administered 2021-10-24: 200 mg via INTRAVENOUS

## 2021-10-24 MED ORDER — MIDAZOLAM HCL 2 MG/2ML IJ SOLN
INTRAMUSCULAR | Status: AC
  Filled 2021-10-24: qty 2

## 2021-10-24 MED ORDER — ONDANSETRON HCL 4 MG PO TABS: 4.0000 mg | ORAL_TABLET | Freq: Four times a day (QID) | ORAL | Status: DC | PRN

## 2021-10-24 MED ORDER — 0.9 % SODIUM CHLORIDE (POUR BTL) OPTIME
TOPICAL | Status: DC | PRN
  Administered 2021-10-24: 1000 mL

## 2021-10-24 MED ORDER — OXYCODONE HCL 5 MG PO TABS
ORAL_TABLET | ORAL | Status: AC
  Filled 2021-10-24: qty 1

## 2021-10-24 MED ORDER — MENTHOL 3 MG MT LOZG: 1.0000 | LOZENGE | OROMUCOSAL | Status: DC | PRN

## 2021-10-24 SURGICAL SUPPLY — 56 items
BAG COUNTER SPONGE SURGICOUNT (BAG) ×2 IMPLANT
BAND RUBBER #18 3X1/16 STRL (MISCELLANEOUS) ×4 IMPLANT
BUR CARBIDE MATCH 3.0 (BURR) ×2 IMPLANT
CARTRIDGE OIL MAESTRO DRILL (MISCELLANEOUS) ×1 IMPLANT
CNTNR URN SCR LID CUP LEK RST (MISCELLANEOUS) ×1 IMPLANT
CONT SPEC 4OZ STRL OR WHT (MISCELLANEOUS) ×2
COVER MAYO STAND STRL (DRAPES) ×2 IMPLANT
DECANTER SPIKE VIAL GLASS SM (MISCELLANEOUS) ×2 IMPLANT
DERMABOND ADVANCED (GAUZE/BANDAGES/DRESSINGS) ×1
DERMABOND ADVANCED .7 DNX12 (GAUZE/BANDAGES/DRESSINGS) IMPLANT
DIFFUSER DRILL AIR PNEUMATIC (MISCELLANEOUS) ×2 IMPLANT
DRAIN JACKSON RD 7FR 3/32 (WOUND CARE) IMPLANT
DRAPE C-ARM 42X72 X-RAY (DRAPES) ×2 IMPLANT
DRAPE LAPAROTOMY 100X72X124 (DRAPES) ×2 IMPLANT
DRAPE MICROSCOPE LEICA (MISCELLANEOUS) ×2 IMPLANT
DRAPE SURG 17X23 STRL (DRAPES) ×2 IMPLANT
DRSG OPSITE POSTOP 3X4 (GAUZE/BANDAGES/DRESSINGS) ×1 IMPLANT
DRSG OPSITE POSTOP 4X6 (GAUZE/BANDAGES/DRESSINGS) ×1 IMPLANT
DURAPREP 26ML APPLICATOR (WOUND CARE) ×2 IMPLANT
ELECT BLADE INSULATED 4IN (ELECTROSURGICAL) ×2
ELECT REM PT RETURN 9FT ADLT (ELECTROSURGICAL) ×2
ELECTRODE BLADE INSULATED 4IN (ELECTROSURGICAL) ×1 IMPLANT
ELECTRODE REM PT RTRN 9FT ADLT (ELECTROSURGICAL) ×1 IMPLANT
EVACUATOR 1/8 PVC DRAIN (DRAIN) ×1 IMPLANT
GAUZE 4X4 16PLY ~~LOC~~+RFID DBL (SPONGE) IMPLANT
GAUZE SPONGE 4X4 12PLY STRL (GAUZE/BANDAGES/DRESSINGS) ×2 IMPLANT
GLOVE SRG 8 PF TXTR STRL LF DI (GLOVE) ×1 IMPLANT
GLOVE SURG LTX SZ8 (GLOVE) ×2 IMPLANT
GLOVE SURG UNDER POLY LF SZ8 (GLOVE) ×2
GOWN STRL REUS W/ TWL LRG LVL3 (GOWN DISPOSABLE) IMPLANT
GOWN STRL REUS W/ TWL XL LVL3 (GOWN DISPOSABLE) ×1 IMPLANT
GOWN STRL REUS W/TWL 2XL LVL3 (GOWN DISPOSABLE) IMPLANT
GOWN STRL REUS W/TWL LRG LVL3 (GOWN DISPOSABLE)
GOWN STRL REUS W/TWL XL LVL3 (GOWN DISPOSABLE) ×2
KIT BASIN OR (CUSTOM PROCEDURE TRAY) ×2 IMPLANT
KIT POSITION SURG JACKSON T1 (MISCELLANEOUS) ×2 IMPLANT
KIT TURNOVER KIT B (KITS) ×2 IMPLANT
MARKER SKIN DUAL TIP RULER LAB (MISCELLANEOUS) ×2 IMPLANT
NDL HYPO 25X1 1.5 SAFETY (NEEDLE) ×1 IMPLANT
NEEDLE HYPO 25X1 1.5 SAFETY (NEEDLE) ×2 IMPLANT
NS IRRIG 1000ML POUR BTL (IV SOLUTION) ×2 IMPLANT
OIL CARTRIDGE MAESTRO DRILL (MISCELLANEOUS) ×2
PACK LAMINECTOMY NEURO (CUSTOM PROCEDURE TRAY) ×2 IMPLANT
PAD ARMBOARD 7.5X6 YLW CONV (MISCELLANEOUS) ×6 IMPLANT
PATTIES SURGICAL .5 X.5 (GAUZE/BANDAGES/DRESSINGS) IMPLANT
PATTIES SURGICAL .5 X1 (DISPOSABLE) IMPLANT
PATTIES SURGICAL 1X1 (DISPOSABLE) IMPLANT
SPONGE SURGIFOAM ABS GEL 12-7 (HEMOSTASIS) ×2 IMPLANT
SPONGE T-LAP 4X18 ~~LOC~~+RFID (SPONGE) IMPLANT
SUT VIC AB 0 CT1 27 (SUTURE) ×2
SUT VIC AB 0 CT1 27XBRD ANBCTR (SUTURE) ×1 IMPLANT
SUT VIC AB 2-0 CP2 18 (SUTURE) ×2 IMPLANT
SUT VIC AB 3-0 SH 8-18 (SUTURE) IMPLANT
TOWEL GREEN STERILE (TOWEL DISPOSABLE) IMPLANT
TOWEL GREEN STERILE FF (TOWEL DISPOSABLE) IMPLANT
WATER STERILE IRR 1000ML POUR (IV SOLUTION) ×2 IMPLANT

## 2021-10-24 NOTE — Anesthesia Preprocedure Evaluation (Signed)
Anesthesia Evaluation  ?Patient identified by MRN, date of birth, ID band ?Patient awake ? ? ? ?Reviewed: ?Allergy & Precautions, H&P , NPO status , Patient's Chart, lab work & pertinent test results ? ?Airway ?Mallampati: II ? ? ?Neck ROM: full ? ? ? Dental ?  ?Pulmonary ?asthma , former smoker,  ?  ?breath sounds clear to auscultation ? ? ? ? ? ? Cardiovascular ?negative cardio ROS ? ? ?Rhythm:regular Rate:Normal ? ? ?  ?Neuro/Psych ?  ? GI/Hepatic ?  ?Endo/Other  ? ? Renal/GU ?  ? ?  ?Musculoskeletal ? ? Abdominal ?  ?Peds ? Hematology ?  ?Anesthesia Other Findings ? ? Reproductive/Obstetrics ? ?  ? ? ? ? ? ? ? ? ? ? ? ? ? ?  ?  ? ? ? ? ? ? ? ? ?Anesthesia Physical ?Anesthesia Plan ? ?ASA: 2 ? ?Anesthesia Plan: General  ? ?Post-op Pain Management:   ? ?Induction: Intravenous ? ?PONV Risk Score and Plan: 2 and Ondansetron, Dexamethasone, Midazolam and Treatment may vary due to age or medical condition ? ?Airway Management Planned: Oral ETT ? ?Additional Equipment:  ? ?Intra-op Plan:  ? ?Post-operative Plan: Extubation in OR ? ?Informed Consent: I have reviewed the patients History and Physical, chart, labs and discussed the procedure including the risks, benefits and alternatives for the proposed anesthesia with the patient or authorized representative who has indicated his/her understanding and acceptance.  ? ? ? ?Dental advisory given ? ?Plan Discussed with: CRNA, Anesthesiologist and Surgeon ? ?Anesthesia Plan Comments:   ? ? ? ? ? ? ?Anesthesia Quick Evaluation ? ?

## 2021-10-24 NOTE — Anesthesia Procedure Notes (Signed)
Procedure Name: Intubation ?Date/Time: 10/24/2021 12:25 PM ?Performed by: Marena Chancy, CRNA ?Pre-anesthesia Checklist: Patient identified, Emergency Drugs available, Suction available and Patient being monitored ?Patient Re-evaluated:Patient Re-evaluated prior to induction ?Oxygen Delivery Method: Circle System Utilized ?Preoxygenation: Pre-oxygenation with 100% oxygen ?Induction Type: IV induction ?Ventilation: Mask ventilation without difficulty and Oral airway inserted - appropriate to patient size ?Laryngoscope Size: Hyacinth Meeker and 2 ?Grade View: Grade I ?Tube type: Oral ?Tube size: 7.5 mm ?Number of attempts: 1 ?Airway Equipment and Method: Stylet and Oral airway ?Placement Confirmation: ETT inserted through vocal cords under direct vision, positive ETCO2 and breath sounds checked- equal and bilateral ?Tube secured with: Tape ?Dental Injury: Teeth and Oropharynx as per pre-operative assessment  ? ? ? ? ?

## 2021-10-24 NOTE — H&P (Signed)
? ? ?Providing Compassionate, Quality Care - Together ? ?NEUROSURGERY HISTORY & PHYSICAL ? ? ?Dan Gonzalez is an 36 y.o. male.   ?Chief Complaint: Bilateral lower extremity numbness ?HPI: This is a 36 year old male with a history of cervical stenosis and lumbar stenosis and has had continued bilateral lower extremity numbness, pain and difficulty standing walking for short distances.  Imaging revealed severe stenosis at L1-2 and L2-3 with broad-based disc bulging at L2-3 and possibly a small disc herniation on the right.  He does complain of right L3 radiculopathy as well.  He failed conservative measures, and presents today for surgical decompression. ? ?Past Medical History:  ?Diagnosis Date  ? Asthma   ? Lumbar stenosis   ? ? ?Past Surgical History:  ?Procedure Laterality Date  ? ANTERIOR CERVICAL DECOMP/DISCECTOMY FUSION N/A 06/04/2021  ? Procedure: ACDF C45;  Surgeon: Sarra Rachels, Alan Mulder, DO;  Location: [facility] OR;  Service: Neurosurgery;  Laterality: N/A;  ? COLONOSCOPY  2022  ? ? ?No family history on file. ?Social History:  reports that he quit smoking about 3 years ago. His smoking use included cigarettes. He has never used smokeless tobacco. He reports that he does not currently use alcohol. He reports current drug use. Drug: Marijuana. ? ?Allergies: No Known Allergies ? ?Medications Prior to Admission  ?Medication Sig Dispense Refill  ? gabapentin (NEURONTIN) 300 MG capsule Take 300-600 mg by mouth See admin instructions. Take 300 mg by mouth in the morning and 600 mg at bedtime    ? HYDROcodone-acetaminophen (NORCO/VICODIN) 5-325 MG tablet Take 1 tablet by mouth 3 (three) times daily as needed for moderate pain.    ? methocarbamol (ROBAXIN) 500 MG tablet Take 1 tablet (500 mg total) by mouth every 6 (six) hours as needed for muscle spasms. 90 tablet 1  ? HYDROcodone-acetaminophen (NORCO) 7.5-325 MG tablet Take 1 tablet by mouth every 4 (four) hours as needed for moderate pain ((score 4 to 6)). (Patient not  taking: Reported on 10/15/2021) 30 tablet 0  ? ? ?Results for orders placed or performed during the hospital encounter of 10/22/21 (from the past 48 hour(s))  ?Surgical PCR Screen     Status: None  ? Collection Time: 10/22/21  2:20 PM  ? Specimen: Nasal Mucosa; Nasal Swab  ?Result Value Ref Range  ? MRSA, PCR NEGATIVE NEGATIVE  ? Staphylococcus aureus NEGATIVE NEGATIVE  ?  Comment: (NOTE) ?The Xpert SA Assay (FDA approved for NASAL specimens in patients 17 ?years of age and older), is one component of a comprehensive ?surveillance program. It is not intended to diagnose infection nor to ?guide or monitor treatment. ?Performed at [facility], [address], [place], [place] ?[address] ?  ?CBC per protocol     Status: None  ? Collection Time: 10/22/21  2:21 PM  ?Result Value Ref Range  ? WBC 7.5 4.0 - 10.5 K/uL  ? RBC 5.73 4.22 - 5.81 MIL/uL  ? Hemoglobin 16.5 13.0 - 17.0 g/dL  ? HCT 48.8 39.0 - 52.0 %  ? MCV 85.2 80.0 - 100.0 fL  ? MCH 28.8 26.0 - 34.0 pg  ? MCHC 33.8 30.0 - 36.0 g/dL  ? RDW 13.2 11.5 - 15.5 %  ? Platelets 301 150 - 400 K/uL  ? nRBC 0.0 0.0 - 0.2 %  ?  Comment: Performed at [facility], [address] [place], [place] [address]  ?SARS Coronavirus 2 by RT PCR (hospital order, performed in [facility] hospital lab) Nasopharyngeal Nasopharyngeal Swab  Status: None  ? Collection Time: 10/22/21  2:21 PM  ? Specimen: Nasopharyngeal Swab  ?Result Value Ref Range  ? SARS Coronavirus 2 NEGATIVE NEGATIVE  ?  Comment: (NOTE) ?SARS-CoV-2 target nucleic acids are NOT DETECTED. ? ?The SARS-CoV-2 RNA is generally detectable in upper and lower ?respiratory specimens during the acute phase of infection. The lowest ?concentration of SARS-CoV-2 viral copies this assay can detect is 250 ?copies / mL. A negative result does not preclude SARS-CoV-2 infection ?and should not be used as the sole basis for treatment or other ?patient management decisions.  A negative result may occur with ?improper specimen  collection / handling, submission of specimen other ?than nasopharyngeal swab, presence of viral mutation(s) within the ?areas targeted by this assay, and inadequate number of viral copies ?(<250 copies / mL). A negative result must be combined with clinical ?observations, patient history, and epidemiological information. ? ?Fact Sheet for Patients:   ?BoilerBrush.com.cy ? ?Fact Sheet for Healthcare Providers: ?https://pope.com/ ? ?This test is not yet approved or  cleared by the [place] FDA and ?has been authorized for detection and/or diagnosis of SARS-CoV-2 by ?FDA under an Emergency Use Authorization (EUA).  This EUA will remain ?in effect (meaning this test can be used) for the duration of the ?COVID-19 declaration under Section 564(b)(1) of the Act, 21 U.S.C. ?section 360bbb-3(b)(1), unless the authorization is terminated or ?revoked sooner. ? ?Performed at [facility], [address], [place], [place] ?[address] ?  ? ?No results found. ? ?ROS ?All pertinent positives and negatives are listed in HPI above ? ?Blood pressure (!) 148/95, pulse 100, temperature 98.9 ?F (37.2 ?C), resp. rate 18, height 5\' 9"  (1.753 m), weight 107 kg, SpO2 100 %. ?Physical Exam  ?Awake alert oriented x3, no acute distress ?PERRLA ?Cranial nerves II through XII intact ?Bilateral upper extremity 5/5 ?Bilateral lower extremity 4+/5 throughout ?Sensory intact light touch ? ?Assessment/Plan ?35 year old male with ? ?Lumbar stenosis with neurogenic claudication, L1-2, L2-3 ? ?-OR today for open laminectomy L1-2, L2-3 possible right L2-3 microdiscectomy.  We discussed all risks, benefits and expected outcomes as well as alternatives to treatment.  Informed consent was obtained.  I answered all of his questions. ? ?Thank you for allowing me to participate in this patient's care.  Please do not hesitate to call with questions or concerns. ? ? ?Tyniesha Howald, DO ?Neurosurgeon ?[facility] ?Cell: [phone] ? ? ? ? ?

## 2021-10-24 NOTE — Op Note (Signed)
?Providing Compassionate, Quality Care - Together ?  ?Date of service: 10/24/2021 ?  ?PREOP DIAGNOSIS:  ?L1-2, L2-3 severe lumbar stenosis with neurogenic claudication ?  ?POSTOP DIAGNOSIS: Same ?  ?PROCEDURE: ?Open bilateral L1, L2, L3 laminectomy for decompression neural elements ?Intraoperative use of microscope for microdissection ?Intraoperative use of fluoroscopy ?  ?SURGEON: Dr. Kendell Bane C. Mykal Batiz, DO ?  ?ASSISTANT: Dr. Hoyt Koch, MD ?  ?ANESTHESIA: General Endotracheal ?  ?EBL: 50 cc ?  ?SPECIMENS: None ?  ?DRAINS: Medium Hemovac ?  ?COMPLICATIONS: None ?  ?CONDITION: Hemodynamically stable ?  ?HISTORY: ?Dan Gonzalez is a 36 y.o. male with a history of cervical myelopathy and lumbar stenosis.  He had lower extremity bilateral burning, pain whenever standing or walking for short distances.  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  He also had some right lower extremity L3 radiculopathy believed due to lateral recess stenosis.  He failed conservative measures therefore I offered surgical intervention in the form of an L1 to, L2-3 open laminectomy, possible discectomy at L2-3.  All risks, benefits and expected outcomes were discussed and agreed upon. ?  ?PROCEDURE IN DETAIL: ?The patient was brought to the operating room. After induction of general anesthesia, the patient was positioned on the operative table in the prone position. All pressure points were meticulously padded. Skin incision was then marked out and prepped and draped in the usual sterile fashion. ?  ?Using a 10 blade, midline incision was created over the L1, L2, L3 spinous processes.  Using Bovie electrocautery soft tissue dissection was performed down to the lumbodorsal fascia.  Subperiosteal dissection was performed bilaterally with Bovie electrocautery exposing the L1, L2, L3 lamina bilaterally.  Deep retractors placed in the wound.  Lateral fluoroscopy  confirmed the appropriate level.  The microscope was sterilely draped and brought into the field.  Using Leksell rongeur, the spinous process of L1, L2, L3 were removed down to the lamina.  Using a high-speed drill, the L2 lamina was removed bilaterally to the lateral recess down to the ligamentum flavum and superiorly up to the ligamentous attachment bilaterally.  Using high-speed drill, a partial bilateral facetectomy was performed down to the ligamentum flavum.  Using a high-speed drill, the superior portion of the L3 lamina was removed down to the epidural space bilaterally.  The ligamentum flavum was then gently dissected from the epidural space and resected with a series of Kerrison rongeurs to the lateral recess bilaterally.  Using a ball-tipped probe, bilateral lateral recesses appeared decompressed.  The bilateral lateral recess were explored again with a ball-tipped probe and noted to be appropriately decompressed.  The thecal sac was pulsatile.  Bilateral foramen were also palpated with a ball-tipped probe and noted to be adequately decompressed.  The right lateral recess was gently dissected and the thecal sac was retracted medially.  Ball-tipped probe was filled under the traversing nerve root and there was no significant disc protrusion or compression of the nerve root. ? ?Using a high-speed drill, the L1 lamina was removed bilaterally to the lateral recess down to the ligamentum flavum and superiorly up to the ligamentous attachment bilaterally.  Using high-speed drill, a partial bilateral facetectomy was performed down to the ligamentum flavum.  Using a high-speed drill, the superior portion of the L2 lamina was removed down to the epidural space bilaterally.  The ligamentum flavum was then gently dissected from the epidural space and resected with a series of Kerrison rongeurs  to the lateral recess bilaterally.  Using a ball-tipped probe, bilateral lateral recesses appeared decompressed.  The  bilateral lateral recess were explored again with a ball-tipped probe and noted to be appropriately decompressed.  The thecal sac was pulsatile.  Bilateral foramen were also palpated with a ball-tipped probe and noted to be adequately decompressed.  ? ?Epidural hemostasis was achieved with Surgifoam. A mixture of Depo-Medrol and fentanyl was placed in the epidural space with Avitene. Deep retractor was taken out of the wound.  Hemostasis was achieved with bipolar cautery and the soft tissues.  A medium Hemovac was tunneled laterally and placed in the epidural space.  The wound was closed in layers with 0 Vicryl sutures for muscle and fascia.  Dermis was closed with 2-0 and 3-0 Vicryl sutures.  Skin was closed with skin glue.  Sterile dressing was applied. ?  ?At the end of the case all sponge, needle, and instrument counts were correct. The patient was then transferred to the stretcher, extubated, and taken to the post-anesthesia care unit in stable hemodynamic condition. ?   ?

## 2021-10-24 NOTE — Transfer of Care (Signed)
Immediate Anesthesia Transfer of Care Note ? ?Patient: Dan Gonzalez ? ?Procedure(s) Performed: OPEN LAMINECTOMY, L12, L23;POSS L23 DISCECTOMY (Right: Spine Lumbar) ? ?Patient Location: PACU ? ?Anesthesia Type:General ? ?Level of Consciousness: awake, alert  and oriented ? ?Airway & Oxygen Therapy: Patient Spontanous Breathing and Patient connected to nasal cannula oxygen ? ?Post-op Assessment: Report given to RN, Post -op Vital signs reviewed and stable and Patient moving all extremities X 4 ? ?Post vital signs: Reviewed and stable ? ?Last Vitals:  ?Vitals Value Taken Time  ?BP 148/94 10/24/21 1500  ?Temp 36.6 ?C 10/24/21 1500  ?Pulse 93 10/24/21 1503  ?Resp 12 10/24/21 1503  ?SpO2 100 % 10/24/21 1503  ?Vitals shown include unvalidated device data. ? ?Last Pain:  ?Vitals:  ? 10/24/21 1027  ?PainSc: 7   ?   ? ?Patients Stated Pain Goal: 4 (10/24/21 1027) ? ?Complications: No notable events documented. ?

## 2021-10-25 ENCOUNTER — Encounter (HOSPITAL_COMMUNITY): Payer: Self-pay | Admitting: Neurological Surgery

## 2021-10-25 DIAGNOSIS — M48062 Spinal stenosis, lumbar region with neurogenic claudication: Secondary | ICD-10-CM | POA: Diagnosis not present

## 2021-10-25 MED ORDER — HYDROCODONE-ACETAMINOPHEN 5-325 MG PO TABS: 1.0000 | ORAL_TABLET | ORAL | 0 refills | Status: AC | PRN

## 2021-10-25 MED ORDER — METHOCARBAMOL 500 MG PO TABS: 500.0000 mg | ORAL_TABLET | Freq: Four times a day (QID) | ORAL | 2 refills | Status: AC

## 2021-10-25 NOTE — Anesthesia Postprocedure Evaluation (Signed)
Anesthesia Post Note ? ?Patient: Tomar Clippard ? ?Procedure(s) Performed: OPEN LAMINECTOMY, L12, L23;POSS L23 DISCECTOMY (Right: Spine Lumbar) ? ?  ? ?Patient location during evaluation: PACU ?Anesthesia Type: General ?Level of consciousness: awake and alert ?Pain management: pain level controlled ?Vital Signs Assessment: post-procedure vital signs reviewed and stable ?Respiratory status: spontaneous breathing, nonlabored ventilation, respiratory function stable and patient connected to nasal cannula oxygen ?Cardiovascular status: blood pressure returned to baseline and stable ?Postop Assessment: no apparent nausea or vomiting ?Anesthetic complications: no ? ? ?No notable events documented. ? ?Last Vitals:  ?Vitals:  ? 10/25/21 0455 10/25/21 0843  ?BP: (!) 142/91 133/85  ?Pulse: 94 90  ?Resp: 20 18  ?Temp: 36.8 ?C 37.1 ?C  ?SpO2: 100% 97%  ?  ?Last Pain:  ?Vitals:  ? 10/25/21 0843  ?TempSrc: Oral  ?PainSc:   ? ? ?  ?  ?  ?  ?  ?  ? ?[place] ? ? ? ? ?

## 2021-10-25 NOTE — Progress Notes (Signed)
Patient left the unit by foot by himself without notifying the staff; wife went ahead of him a few minutes earlier to prepare their transportation; in no acute distress nor complaints of pain nor discomfort the last time the RN assessed him; incision on his back with honeycomb dressing and is clean, dry and intact; room was checked and accounted for all his belongings; discharge instructions concerning his medications, incision care, follow up appointment and when to call the doctor as needed were all discussed with patient and his wife by RN in his room and both expressed understanding on the instructions given. ?

## 2021-10-25 NOTE — Plan of Care (Signed)
?  Problem: Education: Goal: Ability to verbalize activity precautions or restrictions will improve Outcome: Completed/Met Goal: Knowledge of the prescribed therapeutic regimen will improve Outcome: Completed/Met Goal: Understanding of discharge needs will improve Outcome: Completed/Met   Problem: Activity: Goal: Ability to avoid complications of mobility impairment will improve Outcome: Completed/Met Goal: Ability to tolerate increased activity will improve Outcome: Completed/Met Goal: Will remain free from falls Outcome: Completed/Met   Problem: Bowel/Gastric: Goal: Gastrointestinal status for postoperative course will improve Outcome: Completed/Met   Problem: Clinical Measurements: Goal: Ability to maintain clinical measurements within normal limits will improve Outcome: Completed/Met Goal: Postoperative complications will be avoided or minimized Outcome: Completed/Met Goal: Diagnostic test results will improve Outcome: Completed/Met   Problem: Pain Management: Goal: Pain level will decrease Outcome: Completed/Met   Problem: Skin Integrity: Goal: Will show signs of wound healing Outcome: Completed/Met   Problem: Health Behavior/Discharge Planning: Goal: Identification of resources available to assist in meeting health care needs will improve Outcome: Completed/Met   Problem: Bladder/Genitourinary: Goal: Urinary functional status for postoperative course will improve Outcome: Completed/Met   

## 2021-10-25 NOTE — Discharge Summary (Signed)
Physician Discharge Summary  ?Patient ID: ?Dan Gonzalez ?MRN: [id number] ?DOB/AGE: September 15, 1985 36 y.o. ? ?Admit date: 10/24/2021 ?Discharge date: 10/25/2021 ? ?Admission Diagnoses: L1-2, L2-3 severe lumbar stenosis with neurogenic claudication ? ?Discharge Diagnoses: L1-2, L2-3 severe lumbar stenosis with neurogenic claudication ?Principal Problem: ?  Lumbar spinal stenosis ? ? ?Discharged Condition: good ? ?Hospital Course: The patient was admitted on 10/24/2021 and taken to the operating room where the patient underwent open laminectomy L1-2 and L2-3. The patient tolerated the procedure well and was taken to the recovery room and then to the floor in stable condition. The hospital course was routine. There were no complications. The wound remained clean dry and intact. Pt had appropriate back soreness. No complaints of leg pain or new N/T/W. The patient remained afebrile with stable vital signs, and tolerated a regular diet. The patient continued to increase activities, and pain was well controlled with oral pain medications. ? ? ?Consults: None ? ?Significant Diagnostic Studies: radiology: X-Ray: intraoperative ? ? ?Treatments: surgery:  ?Open bilateral L1, L2, L3 laminectomy for decompression neural elements ?Intraoperative use of microscope for microdissection ?Intraoperative use of fluoroscopy ? ? ?Discharge Exam: ?Blood pressure 133/85, pulse 90, temperature 98.8 ?F (37.1 ?C), temperature source Oral, resp. rate 18, height 5\' 9"  (1.753 m), weight 107 kg, SpO2 97 %. ?Physical Exam: Patient is awake, A/O X 4, conversant, and in good spirits. They are in NAD and VSS. Doing well. Speech is fluent and appropriate. MAEW with good strength that is symmetric bilaterally.  BUE 5/5 throughout, BLE 5/5 throughout. Sensation to light touch is intact. PERLA, EOMI. CNs grossly intact. Dressing is clean dry intact. Incision is well approximated with no drainage, erythema, or edema. ? ? ? ? ? ?Disposition: Discharge disposition:  01-Home or Self Care ? ? ? ? ? ? ? ?Allergies as of 10/25/2021   ?No Known Allergies ?  ? ?  ?Medication List  ?  ? ?TAKE these medications   ? ?gabapentin 300 MG capsule ?Commonly known as: NEURONTIN ?Take 300-600 mg by mouth See admin instructions. Take 300 mg by mouth in the morning and 600 mg at bedtime ?  ?HYDROcodone-acetaminophen 5-325 MG tablet ?Commonly known as: NORCO/VICODIN ?Take 1-2 tablets by mouth every 4 (four) hours as needed for moderate pain. ?What changed:  ?how much to take ?when to take this ?Another medication with the same name was removed. Continue taking this medication, and follow the directions you see here. ?  ?methocarbamol 500 MG tablet ?Commonly known as: ROBAXIN ?Take 1 tablet (500 mg total) by mouth every 6 (six) hours as needed for muscle spasms. ?What changed: Another medication with the same name was added. Make sure you understand how and when to take each. ?  ?methocarbamol 500 MG tablet ?Commonly known as: Robaxin ?Take 1 tablet (500 mg total) by mouth 4 (four) times daily. ?What changed: You were already taking a medication with the same name, and this prescription was added. Make sure you understand how and when to take each. ?  ? ?  ? ? ? ?Signed: ?12/25/2021, DNP, NP-C ?10/25/2021, 8:56 AM ? ? ?

## 2021-10-25 NOTE — Discharge Instructions (Signed)

## 2021-10-25 NOTE — Evaluation (Signed)
Occupational Therapy Evaluation ?Patient Details ?Name: Dan Gonzalez ?MRN: [id number] ?DOB: 07-May-1986 ?Today's Date: 10/25/2021 ? ? ?History of Present Illness 36 yo male s/p L2-3 lami on 3/2. Pmh including  asthma, lumbar stenosis, and ACDF on 06/04/21.   ? ?Clinical Impression ?  ?PTA, pt was living with his wife and children and was independent. Currently, pt requires Mod I for ADLs and functional mobility; increased time for back pain. Provided education and handout on back precautions, grooming, bed mobility, LB ADLs, toileting, stair training, and tub transfer; pt demonstrated understanding. Answered all pt questions. Recommend dc home once medically stable per physician. All acute OT needs met and will sign off. Thank you.  ?   ? ?Recommendations for follow up therapy are one component of a multi-disciplinary discharge planning process, led by the attending physician.  Recommendations may be updated based on patient status, additional functional criteria and insurance authorization.  ? ?Follow Up Recommendations ? No OT follow up  ?  ?Assistance Recommended at Discharge    ?Patient can return home with the following   ? ?  ?Functional Status Assessment ?    ?Equipment Recommendations ? None recommended by OT  ?  ?Recommendations for Other Services   ? ? ?  ?Precautions / Restrictions Precautions ?Precautions: Fall ?Restrictions ?Weight Bearing Restrictions: No  ? ?  ? ?Mobility Bed Mobility ?Overal bed mobility: Modified Independent ?  ?  ?  ?  ?  ?  ?General bed mobility comments: bed mobility with log roll ?  ? ?Transfers ?Overall transfer level: Independent ?  ?  ?  ?  ?  ?  ?  ?  ?  ?  ? ?  ?Balance Overall balance assessment: No apparent balance deficits (not formally assessed) ?  ?  ?  ?  ?  ?  ?  ?  ?  ?  ?  ?  ?  ?  ?  ?  ?  ?  ?   ? ?ADL either performed or assessed with clinical judgement  ? ?ADL Overall ADL's : Modified independent ?  ?  ?  ?  ?  ?  ?  ?  ?  ?  ?  ?  ?  ?  ?  ?  ?  ?  ?  ?General  ADL Comments: Providing education on back precautions, compensatory techniques for LB ADLs, bed mobility, grooming, stair training, and functional transfers.  ? ? ? ?Vision   ?   ?   ?Perception   ?  ?Praxis   ?  ? ?Pertinent Vitals/Pain Pain Assessment ?Pain Assessment: Faces ?Faces Pain Scale: Hurts little more ?Pain Location: Back ?Pain Descriptors / Indicators: Discomfort ?Pain Intervention(s): Monitored during session, Repositioned  ? ? ? ?Hand Dominance   ?  ?Extremity/Trunk Assessment Upper Extremity Assessment ?Upper Extremity Assessment: Overall WFL for tasks assessed ?  ?Lower Extremity Assessment ?Lower Extremity Assessment: Overall WFL for tasks assessed ?  ?Cervical / Trunk Assessment ?Cervical / Trunk Assessment: Back Surgery ?  ?Communication Communication ?Communication: No difficulties ?  ?Cognition Arousal/Alertness: Awake/alert ?Behavior During Therapy: [facility] for tasks assessed/performed ?Overall Cognitive Status: Within Functional Limits for tasks assessed ?  ?  ?  ?  ?  ?  ?  ?  ?  ?  ?  ?  ?  ?  ?  ?  ?General Comments: Very motivated ?  ?  ?General Comments  wife present throughout ? ?  ?Exercises   ?  ?Shoulder  Instructions    ? ? ?Home Living Family/patient expects to be discharged to:: Private residence ?Living Arrangements: Spouse/significant other;Children ?Available Help at Discharge: Family;Available 24 hours/day ?Type of Home: House ?Home Access: Stairs to enter ?Entrance Stairs-Number of Steps: 5 ?Entrance Stairs-Rails: Right ?Home Layout: One level ?  ?  ?Bathroom Shower/Tub: Tub/shower unit ?  ?Bathroom Toilet: Standard ?  ?  ?Home Equipment: None ?  ?  ?  ? ?  ?Prior Functioning/Environment Prior Level of Function : Independent/Modified Independent;Working/employed;Driving ?  ?  ?  ?  ?  ?  ?  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 ?  ? ?  ?  ?OT Problem List: Decreased activity tolerance;Decreased knowledge of use of DME or AE;Decreased knowledge of precautions ?  ?   ?OT  Treatment/Interventions:    ?  ?OT Goals(Current goals can be found in the care plan section) Acute Rehab OT Goals ?Patient Stated Goal: Go home ?OT Goal Formulation: All assessment and education complete, DC therapy  ?OT Frequency:   ?  ? ?Co-evaluation   ?  ?  ?  ?  ? ?  ?AM-PAC OT "6 Clicks" Daily Activity     ?Outcome Measure Help from another person eating meals?: None ?Help from another person taking care of personal grooming?: None ?Help from another person toileting, which includes using toliet, bedpan, or urinal?: None ?Help from another person bathing (including washing, rinsing, drying)?: None ?Help from another person to put on and taking off regular upper body clothing?: None ?Help from another person to put on and taking off regular lower body clothing?: None ?6 Click Score: 24 ?  ?End of Session Nurse Communication: Mobility status ? ?Activity Tolerance: Patient tolerated treatment well ?Patient left: in bed;with call bell/phone within reach ? ?OT Visit Diagnosis: Unsteadiness on feet (R26.81);Other abnormalities of gait and mobility (R26.89);Muscle weakness (generalized) (M62.81)  ?              ?Time: [phone] ?OT Time Calculation (min): 18 min ?Charges:  OT General Charges ?$OT Visit: 1 Visit ?OT Evaluation ?$OT Eval Low Complexity: 1 Low ? ?Lateef Juncaj MSOT, OTR/L ?Acute Rehab ?Pager: [phone] ?Office: [phone] ? ?Montrell Cessna M [place] ?10/25/2021, 9:10 AM ?

## 2021-10-25 NOTE — Progress Notes (Signed)
PT Cancellation Note ? ?Patient Details ?Name: Dan Gonzalez ?MRN: [id number] ?DOB: 09/25/1985 ? ? ?Cancelled Treatment:    Reason Eval/Treat Not Completed: PT screened, no needs identified, will sign off Discussed with OT and patient moving independently and safely. PT will sign off.  ? ?Jennell Janosik A. Dan Humphreys, PT, DPT ?[facility] ?Pager [phone] ?Office [phone] ? ? ? ?Shamecca Whitebread A [name] ?10/25/2021, 9:36 AM ?

## 2022-04-30 IMAGING — RF DG LUMBAR SPINE 1V
1 series · 1 of 1 positions shown · non-contrast
Comparison: Lumbar spine radiographs 09/02/2021

CLINICAL DATA: Open laminectomy L1-2 and L2-3; possible L2-3
discectomy.

EXAM:
LUMBAR SPINE - 1 VIEW

[Series 1: run · 1 of 1 slices shown]
[im 1/1]
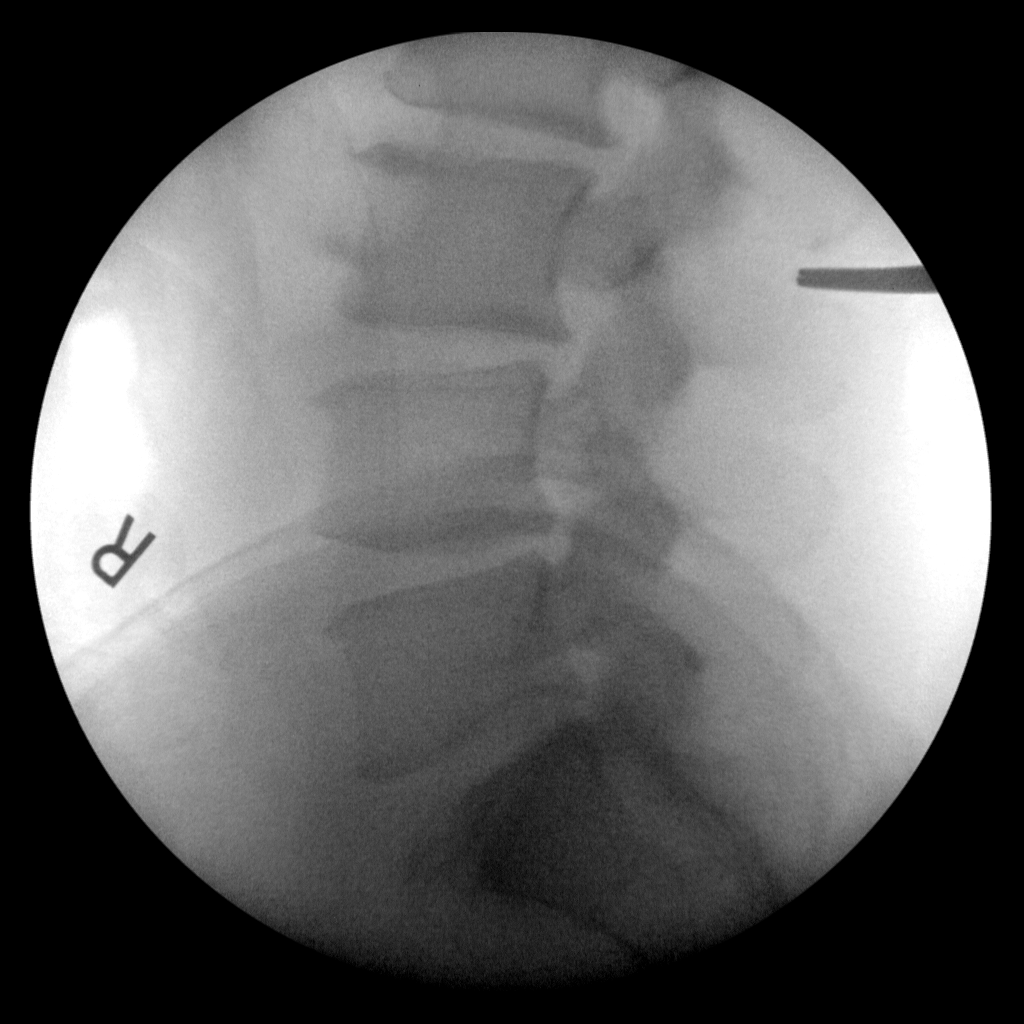

[1 of 1 positions shown; findings below may reference images not displayed]

FINDINGS: On prior lumbar spine radiographs there are considered to be tiny
hypoplastic ribs at the vertebral body considered T12. Distal to
this the next 5 vertebral bodies are considered L1 through L5.

On the current single lateral fluoroscopic image, a metallic probe
from posterior approach points to the L3 spinous process.

Total images: 1

Total fluoroscopy time: 15 seconds

Total dose: 12.42 mGy
IMPRESSION: Fluoroscopy for lumbar spine surgery.
